# Patient Record
Sex: Male | Born: 1975
Health system: Southern US, Community
[De-identification: ages and names within clinical notes are randomized; demographics above are authoritative.]

## PROBLEM LIST (undated history)

## (undated) DIAGNOSIS — T50905A Adverse effect of unspecified drugs, medicaments and biological substances, initial encounter: Secondary | ICD-10-CM

## (undated) DIAGNOSIS — Z8619 Personal history of other infectious and parasitic diseases: Secondary | ICD-10-CM

## (undated) DIAGNOSIS — K625 Hemorrhage of anus and rectum: Secondary | ICD-10-CM

## (undated) DIAGNOSIS — K5090 Crohn's disease, unspecified, without complications: Secondary | ICD-10-CM

## (undated) HISTORY — DX: Personal history of other infectious and parasitic diseases: Z86.19

## (undated) HISTORY — DX: Hemorrhage of anus and rectum: K62.5

## (undated) HISTORY — DX: Crohn's disease, unspecified, without complications: K50.90

## (undated) HISTORY — DX: Adverse effect of unspecified drugs, medicaments and biological substances, initial encounter: T50.905A

---
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
## 1997-01-12 HISTORY — PX: SHOULDER SURGERY: SHX246

## 1997-05-14 ENCOUNTER — Ambulatory Visit (HOSPITAL_BASED_OUTPATIENT_CLINIC_OR_DEPARTMENT_OTHER): Admission: RE | Admit: 1997-05-14 | Discharge: 1997-05-14 | Payer: Self-pay | Admitting: Orthopedic Surgery

## 2003-01-13 HISTORY — PX: ARTHROSCOPIC REPAIR ACL: SUR80

## 2003-08-15 ENCOUNTER — Emergency Department (HOSPITAL_COMMUNITY): Admission: EM | Admit: 2003-08-15 | Discharge: 2003-08-16 | Payer: Self-pay | Admitting: Emergency Medicine

## 2003-09-03 ENCOUNTER — Ambulatory Visit (HOSPITAL_BASED_OUTPATIENT_CLINIC_OR_DEPARTMENT_OTHER): Admission: RE | Admit: 2003-09-03 | Discharge: 2003-09-03 | Payer: Self-pay | Admitting: Orthopedic Surgery

## 2003-09-18 ENCOUNTER — Ambulatory Visit (HOSPITAL_COMMUNITY): Admission: RE | Admit: 2003-09-18 | Discharge: 2003-09-18 | Payer: Self-pay | Admitting: Orthopedic Surgery

## 2006-08-25 ENCOUNTER — Ambulatory Visit: Payer: Self-pay | Admitting: Family Medicine

## 2006-08-25 DIAGNOSIS — J309 Allergic rhinitis, unspecified: Secondary | ICD-10-CM | POA: Insufficient documentation

## 2006-08-25 DIAGNOSIS — R5383 Other fatigue: Secondary | ICD-10-CM

## 2006-08-25 DIAGNOSIS — R5381 Other malaise: Secondary | ICD-10-CM | POA: Insufficient documentation

## 2006-08-25 DIAGNOSIS — R21 Rash and other nonspecific skin eruption: Secondary | ICD-10-CM | POA: Insufficient documentation

## 2006-08-30 ENCOUNTER — Ambulatory Visit: Payer: Self-pay | Admitting: Family Medicine

## 2006-08-30 DIAGNOSIS — D696 Thrombocytopenia, unspecified: Secondary | ICD-10-CM

## 2006-08-30 HISTORY — DX: Thrombocytopenia, unspecified: D69.6

## 2006-08-30 LAB — CONVERTED CEMR LAB
BUN: 13 mg/dL (ref 6–23)
Basophils Relative: 0.1 % (ref 0.0–1.0)
CO2: 28 meq/L (ref 19–32)
Calcium: 9.4 mg/dL (ref 8.4–10.5)
Creatinine, Ser: 1.1 mg/dL (ref 0.4–1.5)
Eosinophils Absolute: 0.2 10*3/uL (ref 0.0–0.6)
Glucose, Bld: 94 mg/dL (ref 70–99)
Hemoglobin: 13.6 g/dL (ref 13.0–17.0)
Neutro Abs: 2.5 10*3/uL (ref 1.4–7.7)
Neutrophils Relative %: 46.9 % (ref 43.0–77.0)
Platelets: 126 10*3/uL — ABNORMAL LOW (ref 150–400)
RBC: 4.64 M/uL (ref 4.22–5.81)
RDW: 13.2 % (ref 11.5–14.6)
Sed Rate: 17 mm/hr (ref 0–20)
TSH: 3.49 microintl units/mL (ref 0.35–5.50)

## 2006-09-02 ENCOUNTER — Ambulatory Visit: Payer: Self-pay | Admitting: Family Medicine

## 2006-09-02 LAB — CONVERTED CEMR LAB
Basophils Absolute: 0 10*3/uL (ref 0.0–0.1)
Basophils Relative: 0.2 % (ref 0.0–1.0)
Eosinophils Relative: 7 % — ABNORMAL HIGH (ref 0.0–5.0)
HCT: 39 % (ref 39.0–52.0)
Hemoglobin: 13.5 g/dL (ref 13.0–17.0)
MCHC: 34.5 g/dL (ref 30.0–36.0)
Monocytes Relative: 10.9 % (ref 3.0–11.0)
Neutro Abs: 2.3 10*3/uL (ref 1.4–7.7)
RBC: 4.52 M/uL (ref 4.22–5.81)
RDW: 13.2 % (ref 11.5–14.6)

## 2006-12-17 ENCOUNTER — Ambulatory Visit: Payer: Self-pay | Admitting: Internal Medicine

## 2006-12-17 DIAGNOSIS — K625 Hemorrhage of anus and rectum: Secondary | ICD-10-CM | POA: Insufficient documentation

## 2006-12-17 HISTORY — DX: Hemorrhage of anus and rectum: K62.5

## 2010-05-30 NOTE — Op Note (Signed)
NAMEJIN, Jesus Lambert                          ACCOUNT NO.:  [id number]   MEDICAL RECORD NO.:  [id number]                   PATIENT TYPE:  AMB   LOCATION:                                       FACILITY:  [facility]   PHYSICIAN:  Feliberto Gottron. Turner Daniels, M.D.                DATE OF BIRTH:  1976/01/04   DATE OF PROCEDURE:  09/03/2003  DATE OF DISCHARGE:                                 OPERATIVE REPORT   PREOPERATIVE DIAGNOSES:  Left knee medial and lateral meniscal tears and  anterior cruciate ligament tear.   POSTOPERATIVE DIAGNOSES:  Left knee medial and lateral meniscal tears and  anterior cruciate ligament tear.   PROCEDURE:  Left knee arthroscopic partial medial and lateral meniscectomies  as well as autograft anterior cruciate ligament reconstruction using a 10 mm  wide bone-tendon-bone autograft, with two L[employer]c Bio-screws for fixation.   SURGEON:  Feliberto Gottron. Turner Daniels, M.D.   ASSISTANTLaural Benes. Su Hilt, P.A.-C.   ANESTHESIA:  General LMA.   ESTIMATED BLOOD LOSS:  Minimal.   FLUIDS REPLACED:  Was 1500 chief complain of crystalloid.   DRAINS:  None.   TOURNIQUET TIME:  None.   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  He came to my office with an MRI in hand, and a 2-3+ effusion.  He wishes to participate in active sports, and because of this, and the  nature of his injury, an ACL reconstruction with a meniscal repair or  debridement was discussed with the patient.  In order to decrease pain and  increase function, an ACL reconstruction is desired by the patient.   DESCRIPTION OF PROCEDURE:  The patient is identified by arm band and taken  to the operating room at [facility].  Appropriate anesthetic monitors were attached and general LMA anesthesia  induced with the patient in the supine position.  The tourniquet applied  high to the left thigh, but never used.  The lateral posts and foot  positioner applied.  The left lower extremity was prepped and draped in the  usual sterile fashion from the ankle to the mid-thigh.  We began the  procedure by making standard inferomedial and inferolateral peripatellar  portals, allowing introduction of the arthroscope through the inferolateral  portal, and outflow to the inferomedial portal.  Serosanguineous fluid was  lavaged out. We immediately identified an inner third bucket handle tear of  the medial meniscus, white on white, which was then removed with the  straight biters and the 4.2 great white sucker shaver, as well as the 3.5  gator sucker shaver.  On the lateral side complex a double tear, big tear  was identified and likewise debrided.  The ACL was noted to be torn off of  its origin in  the notch and this was also debrided.  Having completed the  meniscal debridement, the knee was then placed in the foot positioner and  flexed to about 45 degrees, allowing the complete removal of the ACL stumps,  as well as standard superior lateral notch-plasties and a 4.2 great white  sucker shaver.  Satisfied with this, we went ahead and harvested our graft,  making an anterior midline incision from mid-patella to 1 cm distal to and  medial to the tibial tubercle, through the skin and subcutaneous tissue, to  the transverse retinaculum over the patellar tendon.  Small bleeders were  identified and cauterized.  The patellar tendon was measured at 31 mm, and a  standard 10 mm bone-tendon-bone autograft was harvested from the mid-patella  down to the tibial tubercle where we likewise took a 22 mm bone block.  A  #20 gauge steel wire was placed through the distal bone block and the graft  taken to the back table and sized so it would fit through 10 mm sizing  rings.  Meanwhile the Linvatec tibial pin guide was used and set at 55  degrees, to place the guide pin from the medial  flare of the tibia up  through the posterior footprint of the ACL insertion, and then over-reamed  with a 10 mm Badger reamer, utilizing the anterior midline incision.  Through the first tunnel a second pin was passed into over-the-top position  7 mm anterior to the posterior edge of the lateral femoral condyle at the  1:30 position on the clock.  This was over-reamed with a 10 mm Badger reamer  to a depth of 30 mm, and then utilizing the first tunnel, a notch was placed  superiorly in the femoral tunnel and laterally in the tibial tunnel.  The  autograft was then threaded through the tunnels using guide pins to push it  up, and the patellar bone block was threaded into the femoral tunnel, the  knee hyper-flexed and then this block locked into place using an 8 x 20 Bio-  screw from [employer], obtaining good squeaking as the screw drove home.  The  knee was taken through a range of motion, confirming isometry.  Placed at 45  degrees flexion with a reverse Lachman's maneuver, twisted 90 degrees, and  then a second 9 x 20 Bio-screw placed compressing the tibial bone block into  the tibia.  The graft was probed to make sure that the tension was good in  flexion and full extension.  Photographic documentation was made of the  graft.  The knee was irrigated out with normal saline solution, and the  arthroscopic instruments removed.  The patella retinaculum closed with #3-0  Vicryl suture.  The subcutaneous tissue closed with #3-0 Vicryl suture, and  the skin with running #4-0 Monocryl subcuticular suture.  A dressing of  Xeroform, 4 x 4 dressing, sponges, Webril and an Ace wrap applied.  The patient was taken out of the foot positioner, awakened, and taken to the  recovery room without difficulty.                                               Feliberto Gottron. Turner Daniels, M.D.    Ovid Curd  D:  09/19/2003  T:  09/19/2003  Job:  [id number]

## 2012-04-13 ENCOUNTER — Ambulatory Visit: Payer: Self-pay

## 2012-05-30 ENCOUNTER — Other Ambulatory Visit: Payer: Self-pay | Admitting: Gastroenterology

## 2012-05-30 ENCOUNTER — Ambulatory Visit
Admission: RE | Admit: 2012-05-30 | Discharge: 2012-05-30 | Disposition: A | Discharge status: Home / Self Care | Payer: BC Managed Care – PPO | Source: Ambulatory Visit | Attending: Gastroenterology | Admitting: Gastroenterology

## 2012-05-30 DIAGNOSIS — R0781 Pleurodynia: Secondary | ICD-10-CM

## 2012-06-02 ENCOUNTER — Other Ambulatory Visit: Payer: Self-pay | Admitting: Gastroenterology

## 2012-06-02 DIAGNOSIS — R109 Unspecified abdominal pain: Secondary | ICD-10-CM

## 2012-06-03 ENCOUNTER — Other Ambulatory Visit: Payer: Self-pay | Admitting: Gastroenterology

## 2012-06-03 ENCOUNTER — Ambulatory Visit
Admission: RE | Admit: 2012-06-03 | Discharge: 2012-06-03 | Disposition: A | Discharge status: Home / Self Care | Payer: BC Managed Care – PPO | Source: Ambulatory Visit | Attending: Gastroenterology | Admitting: Gastroenterology

## 2012-06-03 DIAGNOSIS — R1011 Right upper quadrant pain: Secondary | ICD-10-CM

## 2012-06-03 DIAGNOSIS — R7989 Other specified abnormal findings of blood chemistry: Secondary | ICD-10-CM

## 2012-06-03 MED ORDER — IOHEXOL 300 MG/ML  SOLN
100.0000 mL | Freq: Once | INTRAMUSCULAR | Status: AC | PRN
  Administered 2012-06-03: 100 mL via INTRAVENOUS

## 2012-06-08 ENCOUNTER — Other Ambulatory Visit: Payer: Self-pay | Admitting: Gastroenterology

## 2012-06-08 DIAGNOSIS — R109 Unspecified abdominal pain: Secondary | ICD-10-CM

## 2012-06-09 ENCOUNTER — Encounter (HOSPITAL_COMMUNITY): Payer: Self-pay | Admitting: Nurse Practitioner

## 2012-06-09 ENCOUNTER — Inpatient Hospital Stay (HOSPITAL_COMMUNITY)
Admission: EM | Admit: 2012-06-09 | Discharge: 2012-06-12 | DRG: 552 | Disposition: A | Discharge status: Home / Self Care | Payer: BC Managed Care – PPO | Attending: Internal Medicine | Admitting: Internal Medicine

## 2012-06-09 DIAGNOSIS — D696 Thrombocytopenia, unspecified: Secondary | ICD-10-CM

## 2012-06-09 DIAGNOSIS — R21 Rash and other nonspecific skin eruption: Secondary | ICD-10-CM

## 2012-06-09 DIAGNOSIS — R5383 Other fatigue: Secondary | ICD-10-CM

## 2012-06-09 DIAGNOSIS — R634 Abnormal weight loss: Secondary | ICD-10-CM | POA: Diagnosis present

## 2012-06-09 DIAGNOSIS — R112 Nausea with vomiting, unspecified: Secondary | ICD-10-CM

## 2012-06-09 DIAGNOSIS — K5090 Crohn's disease, unspecified, without complications: Principal | ICD-10-CM | POA: Diagnosis present

## 2012-06-09 DIAGNOSIS — K625 Hemorrhage of anus and rectum: Secondary | ICD-10-CM

## 2012-06-09 DIAGNOSIS — K50919 Crohn's disease, unspecified, with unspecified complications: Secondary | ICD-10-CM

## 2012-06-09 DIAGNOSIS — R5381 Other malaise: Secondary | ICD-10-CM

## 2012-06-09 DIAGNOSIS — E43 Unspecified severe protein-calorie malnutrition: Secondary | ICD-10-CM | POA: Insufficient documentation

## 2012-06-09 DIAGNOSIS — R197 Diarrhea, unspecified: Secondary | ICD-10-CM | POA: Diagnosis present

## 2012-06-09 DIAGNOSIS — M130 Polyarthritis, unspecified: Secondary | ICD-10-CM

## 2012-06-09 DIAGNOSIS — J309 Allergic rhinitis, unspecified: Secondary | ICD-10-CM

## 2012-06-09 DIAGNOSIS — K859 Acute pancreatitis without necrosis or infection, unspecified: Secondary | ICD-10-CM | POA: Diagnosis present

## 2012-06-09 DIAGNOSIS — IMO0002 Reserved for concepts with insufficient information to code with codable children: Secondary | ICD-10-CM

## 2012-06-09 DIAGNOSIS — E860 Dehydration: Secondary | ICD-10-CM | POA: Diagnosis present

## 2012-06-09 DIAGNOSIS — K639 Disease of intestine, unspecified: Secondary | ICD-10-CM | POA: Diagnosis present

## 2012-06-09 HISTORY — DX: Crohn's disease, unspecified, without complications: K50.90

## 2012-06-09 LAB — CBC
HCT: 35.4 % — ABNORMAL LOW (ref 39.0–52.0)
MCH: 29.2 pg (ref 26.0–34.0)
MCHC: 35 g/dL (ref 30.0–36.0)
MCV: 83.5 fL (ref 78.0–100.0)
RDW: 13.1 % (ref 11.5–15.5)

## 2012-06-09 LAB — COMPREHENSIVE METABOLIC PANEL
ALT: 10 U/L (ref 0–53)
Albumin: 3.4 g/dL — ABNORMAL LOW (ref 3.5–5.2)
BUN: 10 mg/dL (ref 6–23)
CO2: 23 mEq/L (ref 19–32)
Potassium: 3.9 mEq/L (ref 3.5–5.1)

## 2012-06-09 LAB — URINALYSIS, ROUTINE W REFLEX MICROSCOPIC
Glucose, UA: NEGATIVE mg/dL
Ketones, ur: 15 mg/dL — AB
Nitrite: NEGATIVE
Protein, ur: NEGATIVE mg/dL

## 2012-06-09 LAB — CBC WITH DIFFERENTIAL/PLATELET
Basophils Relative: 0 % (ref 0–1)
HCT: 40.9 % (ref 39.0–52.0)
MCH: 29.2 pg (ref 26.0–34.0)
MCHC: 34.7 g/dL (ref 30.0–36.0)
MCV: 84 fL (ref 78.0–100.0)
Monocytes Relative: 7 % (ref 3–12)
Neutro Abs: 11.7 10*3/uL — ABNORMAL HIGH (ref 1.7–7.7)
Platelets: 407 10*3/uL — ABNORMAL HIGH (ref 150–400)
RBC: 4.87 MIL/uL (ref 4.22–5.81)
RDW: 13.3 % (ref 11.5–15.5)
WBC: 14.2 10*3/uL — ABNORMAL HIGH (ref 4.0–10.5)

## 2012-06-09 LAB — LIPASE, BLOOD: Lipase: 1381 U/L — ABNORMAL HIGH (ref 11–59)

## 2012-06-09 LAB — POCT I-STAT, CHEM 8
Calcium, Ion: 1.19 mmol/L (ref 1.12–1.23)
Chloride: 103 mEq/L (ref 96–112)
Creatinine, Ser: 1.1 mg/dL (ref 0.50–1.35)
Glucose, Bld: 89 mg/dL (ref 70–99)
HCT: 44 % (ref 39.0–52.0)

## 2012-06-09 LAB — URINE MICROSCOPIC-ADD ON

## 2012-06-09 MED ORDER — HYDROMORPHONE HCL PF 1 MG/ML IJ SOLN
1.0000 mg | Freq: Once | INTRAMUSCULAR | Status: AC
  Administered 2012-06-09: 1 mg via INTRAVENOUS
  Filled 2012-06-09: qty 1

## 2012-06-09 MED ORDER — MESALAMINE 1.2 G PO TBEC
2400.0000 mg | DELAYED_RELEASE_TABLET | Freq: Two times a day (BID) | ORAL | Status: DC
  Filled 2012-06-09 (×3): qty 2

## 2012-06-09 MED ORDER — DIAZEPAM 5 MG/ML IJ SOLN
2.5000 mg | Freq: Once | INTRAMUSCULAR | Status: AC
  Administered 2012-06-09: 2.5 mg via INTRAVENOUS
  Filled 2012-06-09: qty 2

## 2012-06-09 MED ORDER — NAPROXEN 500 MG PO TABS
500.0000 mg | ORAL_TABLET | Freq: Two times a day (BID) | ORAL | Status: DC
  Filled 2012-06-09 (×3): qty 1

## 2012-06-09 MED ORDER — SODIUM CHLORIDE 0.9 % IV BOLUS (SEPSIS)
1000.0000 mL | Freq: Once | INTRAVENOUS | Status: AC
  Administered 2012-06-09: 1000 mL via INTRAVENOUS

## 2012-06-09 MED ORDER — ENOXAPARIN SODIUM 40 MG/0.4ML ~~LOC~~ SOLN
40.0000 mg | SUBCUTANEOUS | Status: DC
  Administered 2012-06-09 – 2012-06-10 (×2): 40 mg via SUBCUTANEOUS
  Filled 2012-06-09 (×4): qty 0.4

## 2012-06-09 MED ORDER — SODIUM CHLORIDE 0.9 % IV SOLN
INTRAVENOUS | Status: AC
  Administered 2012-06-09: 21:00:00 via INTRAVENOUS

## 2012-06-09 NOTE — H&P (Signed)
[facility] History and Physical  Jesus Lambert UUV:[id number] DOB: 02-05-1975 DOA: 06/09/2012  Referring physician: Emergency department PCP: Kerby Nora, MD  Specialists: Dr. Loreta Ave  Chief Complaint: Diarrhea, joint pains  HPI: Jesus Lambert is a 37 y.o. male who was referred to the emergency department by his gastroenterologist, Dr. Loreta Ave. Patient was diagnosed with Crohn's disease as an outpatient approximately 5 weeks prior to this hospital admission. It is during this time that the patient apparently was continued on mesalamine as an outpatient. The patient traveled out of the state for an event several weeks ago at which point he started noticing increased diarrhea and abdominal discomfort. Upon returning from his trip, the patient's he noted generalized malaise as well as multiple joint pains. Patient denies any fevers, oral or genital ulcers. Patient did recall a brief episode of "blurry vision"recently that had self resolved. The patient does report approximately 18 pounds weight loss since the onset of the symptoms.  Review of Systems:  Notable for weight loss, multiple joint pains, neck pain, diarrhea all other review of systems reviewed and are negative  Past Medical History  Diagnosis Date  . Crohn's disease    Past Surgical History  Procedure Laterality Date  . Appendectomy     Social History:  reports that he has never smoked. He does not have any smokeless tobacco history on file. He reports that  drinks alcohol. He reports that he does not use illicit drugs.   Allergies  Allergen Reactions  . Lactose Intolerance (Gi)     History reviewed. No pertinent family history.  (be sure to complete)  Prior to Admission medications   Medication Sig Start Date End Date Taking? Authorizing Provider  fish oil-omega-3 fatty acids 1000 MG capsule Take 2 g by mouth daily.   Yes Historical Provider, MD  mesalamine (LIALDA) 1.2 G EC tablet Take 2,400 mg by mouth 2 (two) times  daily.   Yes Historical Provider, MD  traMADol (ULTRAM) 50 MG tablet Take 50 mg by mouth every 6 (six) hours as needed for pain.   Yes Historical Provider, MD  triamcinolone (NASACORT) 55 MCG/ACT nasal inhaler Place 2 sprays into the nose daily as needed (for allergies).   Yes Historical Provider, MD   Physical Exam: Filed Vitals:   06/09/12 1305 06/09/12 1600 06/09/12 1630 06/09/12 1700  BP: 111/74 112/73 131/69 117/71  Pulse: 86 86 91 83  Temp: 98.1 F (36.7 C)     TempSrc: Oral     Resp: 16     SpO2: 99% 100% 100% 100%     General:  Patient is awake in no apparent distress  Eyes: Pupils are equal round reactive to light bilaterally  ENT: Because membranes moist, no oral lesions  Neck: Trachea midline, neck tender over upper posterior neck  Cardiovascular: Regular S1-S2  Respiratory: Normal respiratory effort, no crackles no wheezing  Abdomen: Soft, positive bowel sounds  Skin: No notable skin lesions seen, normal skin turgor  Musculoskeletal: No clubbing or cyanosis, distally perfused  Psychiatric: Appears normal  Neurologic: Cranial nerves II through XII grossly intact, strength and sensation intact throughout  Labs on Admission:  Basic Metabolic Panel:  Recent Labs Lab 06/09/12 1607 06/09/12 1627  NA 135 138  K 3.9 4.1  CL 97 103  CO2 23  --   GLUCOSE 90 89  BUN 10 9  CREATININE 1.12 1.10  CALCIUM 9.8  --    Liver Function Tests:  Recent Labs Lab 06/09/12 1607  AST 13  ALT 10  ALKPHOS 55  BILITOT 0.5  PROT 8.5*  ALBUMIN 3.4*    Recent Labs Lab 06/09/12 1607  LIPASE 1381*   No results found for this basename: AMMONIA,  in the last 168 hours CBC:  Recent Labs Lab 06/09/12 1607 06/09/12 1627  WBC 14.2*  --   NEUTROABS 11.7*  --   HGB 14.2 15.0  HCT 40.9 44.0  MCV 84.0  --   PLT 407*  --    Cardiac Enzymes: No results found for this basename: CKTOTAL, CKMB, CKMBINDEX, TROPONINI,  in the last 168 hours  BNP (last 3 results) No  results found for this basename: PROBNP,  in the last 8760 hours CBG: No results found for this basename: GLUCAP,  in the last 168 hours  Radiological Exams on Admission: No results found.    Assessment/Plan Principal Problem:   Crohn's disease Active Problems:   Polyarthritis   Abnormal weight loss   Crohn's disease: -The patient is followed by Dr. Loreta Ave as an outpatient, who will see this patient while in the hospital -For now, we will continue patient's home regimen of mesalamine and will defer medication changes to gastroenterology. -For now, we will continue patient with a diet as tolerated in light of his abnormal weight loss (see below)  Polyarthritis: -This is most interesting and may be a reactive arthritis in the setting of his recently diagnosed Crohn's disease. -For the time being, we will continue the patient with naproxen 500 mg by mouth twice a day. Ultimately, he may benefit from steroids -An ESR as well as CRP have been drawn and is currently pending at the time of this dictation.  Abnormal weight loss: -As per above, we'll continue patient with a diet as tolerated for the time being. -We will consult the nutrition service in light of his reported 18 pound weight loss  DVT prophylaxis: -We'll continue patient with Lovenox   Code Status: Full (must indicate code status--if unknown or must be presumed, indicate so) Family Communication: Patient and wife in room (indicate person spoken with, if applicable, with phone number if by telephone) Disposition Plan: Pending (indicate anticipated LOS)  Time spent: 35 minutes  CHIU, STEPHEN K [facility] Pager [phone]  If 7PM-7AM, please contact night-coverage [email or web address] Password [facility] 06/09/2012, 6:04 PM

## 2012-06-09 NOTE — ED Provider Notes (Signed)
History     CSN: [id number]  Arrival date & time 06/09/12  1242   First MD Initiated Contact with Patient 06/09/12 1600      Chief Complaint  Patient presents with  . Generalized Body Aches    HPI 37 yo M presents with multiple complaints related to recent diagnosis of Crohn's disease.  He was diagnosed with Crohn's 2 weeks ago after colonoscopy.  He is followed by Dr. Loreta Ave with GI.  She called ahead and reported that she was sending the patient to be admitted.  She reports that he has lost nearly 20 pounds in less than once month.  He has severe nausea, poor PO intake, and severe fatigue.  He has had 2 weeks of diarrhea consisting of loose stool with no blood or mucus, multiple episodes daily.  He has had ongoing LLQ pain for two weeks; the pain is achy, moderate in severity, unchanged for two weeks, aggravated by nothing in particular, relieved by nothing.  He also has diffuse myalgias and neck pain and joint pain in multiple joints.    He had a CT scan of his abdomen with contrast 3 days ago, and it demonstrated some non-specific bowel wall thickening, but no complication such as abscess or fistula.  He has also had a negative RUQ ultrasound for gallstones or cholecystitis.  He is taking mesalamine.  He has been on steroids, but is currently not taking steroids, as he had a poor response to them.     Past Medical History  Diagnosis Date  . Crohn's disease     Past Surgical History  Procedure Laterality Date  . Appendectomy      History reviewed. No pertinent family history.  History  Substance Use Topics  . Smoking status: Never Smoker   . Smokeless tobacco: Not on file  . Alcohol Use: Yes     Comment: occasional      Review of Systems  Constitutional: Positive for chills, appetite change, fatigue and unexpected weight change. Negative for fever.  HENT: Positive for neck pain and neck stiffness. Negative for congestion and rhinorrhea.   Eyes: Negative for visual  disturbance.  Respiratory: Negative for cough and shortness of breath.   Cardiovascular: Negative for chest pain and leg swelling.  Gastrointestinal: Positive for nausea, abdominal pain and diarrhea. Negative for vomiting, constipation, blood in stool and abdominal distention.  Genitourinary: Negative for dysuria, urgency, frequency, flank pain and difficulty urinating.  Musculoskeletal: Positive for myalgias and arthralgias. Negative for back pain and joint swelling.  Skin: Negative for rash.  Neurological: Positive for weakness. Negative for syncope, numbness and headaches.  All other systems reviewed and are negative.    Allergies  Lactose intolerance (gi)  Home Medications  No current outpatient prescriptions on file.  BP 104/68  Pulse 85  Temp(Src) 98.5 F (36.9 C) (Oral)  Resp 17  Ht 5\' 6"  (1.676 m)  Wt 141 lb 15.6 oz (64.4 kg)  BMI 22.93 kg/m2  SpO2 95%  Physical Exam  Nursing note and vitals reviewed. Constitutional: He is oriented to person, place, and time. He appears well-developed and well-nourished. No distress.  Patient looks chronically ill, uncomfortable, pale  HENT:  Head: Normocephalic and atraumatic.  Mouth/Throat: Oropharynx is clear and moist.  Eyes: Conjunctivae and EOM are normal. Pupils are equal, round, and reactive to light. No scleral icterus.  Neck: Normal range of motion. Neck supple. No JVD present. Muscular tenderness present. No spinous process tenderness present. No Brudzinski's sign and no  Kernig's sign noted.  Muscle spasm predominantly on right side  Cardiovascular: Normal rate, regular rhythm, normal heart sounds and intact distal pulses.  Exam reveals no gallop and no friction rub.   No murmur heard. Pulmonary/Chest: Effort normal and breath sounds normal. No respiratory distress. He has no wheezes. He has no rales.  Abdominal: Soft. Normal appearance and bowel sounds are normal. He exhibits no distension. There is no tenderness. There is  no rigidity, no rebound, no guarding, no CVA tenderness, no tenderness at McBurney's point and negative Murphy's sign. No hernia.  Musculoskeletal: He exhibits no edema.  Neurological: He is alert and oriented to person, place, and time. No cranial nerve deficit. He exhibits normal muscle tone. Coordination normal.  Skin: Skin is warm and dry. He is not diaphoretic.    ED Course  Procedures (including critical care time)  Labs Reviewed  LIPASE, BLOOD - Abnormal; Notable for the following:    Lipase 1381 (*)    All other components within normal limits  COMPREHENSIVE METABOLIC PANEL - Abnormal; Notable for the following:    Total Protein 8.5 (*)    Albumin 3.4 (*)    GFR calc non Af Amer 82 (*)    All other components within normal limits  CBC WITH DIFFERENTIAL - Abnormal; Notable for the following:    WBC 14.2 (*)    Platelets 407 (*)    Neutrophils Relative % 82 (*)    Lymphocytes Relative 10 (*)    Neutro Abs 11.7 (*)    All other components within normal limits  URINALYSIS, ROUTINE W REFLEX MICROSCOPIC - Abnormal; Notable for the following:    Ketones, ur 15 (*)    Leukocytes, UA SMALL (*)    All other components within normal limits  SEDIMENTATION RATE - Abnormal; Notable for the following:    Sed Rate 73 (*)    All other components within normal limits  C-REACTIVE PROTEIN - Abnormal; Notable for the following:    CRP 11.6 (*)    All other components within normal limits   Ct Cervical Spine Wo Contrast  06/10/2012   *RADIOLOGY REPORT*  Clinical Data: Neck pain.  No trauma.  Crohns disease.  CT CERVICAL SPINE WITHOUT CONTRAST  Technique:  Multidetector CT imaging of the cervical spine was performed. Multiplanar CT image reconstructions were also generated.  Comparison: None  Findings: Normal cervical alignment.  Normal lordosis.  Negative for fracture or mass.  C1-2:  Negative  C2-3:  Negative  C3-4:  Negative  C4-5:  Mild spondylosis and mild facet degeneration without spinal  stenosis.  C5-6:  Mild spondylosis and mild facet degeneration.  Left paracentral osteophyte.  Mild narrowing of the left foramen due to spurring.  C6-7:  Spondylosis with central osteophyte.  Mild facet degeneration without significant spinal stenosis  C7-C1:  Negative  IMPRESSION: Mild cervical spondylosis without significant spinal stenosis.  No acute bony change.   Original Report Authenticated By: Janeece Riggers, M.D.     1. Crohn's disease, acute, without complications   2. Nausea and vomiting   3. Weight loss   4. Other malaise and fatigue   5. Polyarthritis   6. Crohn's disease, unspecified complication   7. Abnormal weight loss       MDM  37 yo M, recently dx with Crohn's disease, presents with wt loss of 20 lb, myalgias, fatigue, arthralgias, nausea, diarrhea, failure to thrive.  Failed treatment with steroids and mesalamine so far.  Followed by Dr. Loreta Ave with  GI, sent here for admit.  Leukocytosis to 14, stable H and H, lipase 1300, moderately elevated ESR and CRP.  Needs autoimmune workup, supportive care for malnutrition/dehydration, symptom control, better control of his Crohn's.  Admitted to medicine, GI will follow.    Possible mesalamine side effect?  Doubt pancreatitis.  May need ERCP.  Stable ED course.        Toney Sang, MD 06/10/12 1328

## 2012-06-09 NOTE — ED Notes (Signed)
Pt reports he was diagnosed with crohns 2 weeks ago after colonoscopy, also had an abd ultrasound and ct scan at this time. States since he was diagnosed he still has abd pain but has also started to have joint paint all over body, and has lost 17 lbs. Pt was started on medication for crohns but has not helped.

## 2012-06-09 NOTE — ED Notes (Signed)
Report given to floor, Liborio Nixon.

## 2012-06-09 NOTE — ED Notes (Signed)
States his GI MD talked to Dr Thedore Mins about admission for further workup

## 2012-06-09 NOTE — ED Notes (Signed)
Pt given saltine crackers. 

## 2012-06-09 NOTE — Consult Note (Signed)
Reason for Consult: neck pain Referring Physician: Dr. Loreta Ave  CC: neck pain  History is obtained from:patient  HPI: Jesus Lambert is a 37 y.o. male who was recently diagnosed with ulcerative colitis and started on mesalmine. Today, he is complaining of diffuse pains in multiple joints with stiffness in those joints in addition to some right posterior neck pain. This does feel stiff similar to his other joints. He is afebrile, though does have a leukocytosis.     Past Medical History  Diagnosis Date  . Crohn's disease     Exam: Current vital signs: BP 118/70  Pulse 93  Temp(Src) 98.1 F (36.7 C) (Oral)  Resp 16  SpO2 100% Vital signs in last 24 hours: Temp:  [98.1 F (36.7 C)] 98.1 F (36.7 C) (05/29 1305) Pulse Rate:  [83-93] 93 (05/29 1800) Resp:  [16] 16 (05/29 1305) BP: (111-131)/(69-79) 118/70 mmHg (05/29 1800) SpO2:  [99 %-100 %] 100 % (05/29 1800)  General: in bed, nad CV: RR Mental Status: Patient is awake, alert, oriented to person, place, month, year, and situation. Immediate and remote memory are intact. Patient is able to give a clear and coherent history. Cranial Nerves: II: Visual Fields are full. Pupils are equal, round, and reactive to light.   III,IV, VI: EOMI without ptosis or diploplia.  V: Facial sensation is symmetric to temperature VII: Facial movement is symmetric.  Motor: Intact with 5/5 strength throughout, though limited by pain to some degree.  Sensory: Sensation is symmetric to light touch  Neck: Supple with forward/backward movements, but he resists sided to side movements secondaryt o pain.   I have reviewed labs in epic and the results pertinent to this consultation are: Elevated WBC  Impression: 37 yo M with multiple joint pain in addition to right sided neck pain. I suspect that he may have a reactive arthritis. With the unilateral nature, lack of fever, and similarity to other joint pains, I do not have a high concern for  meningitis.   Recommendations: 1) No specific recommendations from neurological standpoint, will sign off at this time.  2) Please call if any other questions remain.    Ritta Slot, MD [facility] [phone]  If 7pm- 7am, please page neurology on call at [phone].

## 2012-06-09 NOTE — ED Notes (Signed)
MD at bedside. Dr. Peyton Najjar

## 2012-06-09 NOTE — Progress Notes (Signed)
Pt orientation to unit, room and routine. Information packet given to patient/family.  Admission INP armband ID verified with patient/family, and in place. SR up x 2, fall risk assessment complete with Patient and family verbalizing understanding of risks associated with falls. Pt verbalizes an understanding of how to use the call bell and to call for help before getting out of bed.   Will cont to monitor and assist as needed.  Gilman Schmidt, RN

## 2012-06-10 ENCOUNTER — Inpatient Hospital Stay (HOSPITAL_COMMUNITY): Payer: BC Managed Care – PPO

## 2012-06-10 DIAGNOSIS — E43 Unspecified severe protein-calorie malnutrition: Secondary | ICD-10-CM

## 2012-06-10 DIAGNOSIS — R634 Abnormal weight loss: Secondary | ICD-10-CM

## 2012-06-10 DIAGNOSIS — M130 Polyarthritis, unspecified: Secondary | ICD-10-CM

## 2012-06-10 LAB — LIPID PANEL
HDL: 23 mg/dL — ABNORMAL LOW (ref >39)
LDL Cholesterol: 94 mg/dL (ref 0–99)
Total CHOL/HDL Ratio: 5.9 RATIO

## 2012-06-10 LAB — COMPREHENSIVE METABOLIC PANEL
ALT: 7 U/L (ref 0–53)
Alkaline Phosphatase: 43 U/L (ref 39–117)
BUN: 7 mg/dL (ref 6–23)
CO2: 24 mEq/L (ref 19–32)
Chloride: 103 mEq/L (ref 96–112)
GFR calc Af Amer: >90 mL/min (ref >90)
GFR calc non Af Amer: 85 mL/min — ABNORMAL LOW (ref >90)
Glucose, Bld: 95 mg/dL (ref 70–99)
Potassium: 3.9 mEq/L (ref 3.5–5.1)
Total Bilirubin: 0.4 mg/dL (ref 0.3–1.2)

## 2012-06-10 LAB — CBC
MCHC: 34.5 g/dL (ref 30.0–36.0)
Platelets: 326 10*3/uL (ref 150–400)
RDW: 13 % (ref 11.5–15.5)
WBC: 10.1 10*3/uL (ref 4.0–10.5)

## 2012-06-10 MED ORDER — MORPHINE SULFATE 2 MG/ML IJ SOLN
2.0000 mg | INTRAMUSCULAR | Status: DC | PRN
  Administered 2012-06-10 (×2): 2 mg via INTRAVENOUS
  Filled 2012-06-10 (×2): qty 1

## 2012-06-10 MED ORDER — HYDROMORPHONE HCL PF 1 MG/ML IJ SOLN
1.0000 mg | INTRAMUSCULAR | Status: DC | PRN
  Administered 2012-06-10: 1 mg via INTRAVENOUS
  Filled 2012-06-10: qty 1

## 2012-06-10 MED ORDER — METHYLPREDNISOLONE SODIUM SUCC 40 MG IJ SOLR
40.0000 mg | Freq: Two times a day (BID) | INTRAMUSCULAR | Status: DC
  Administered 2012-06-10 – 2012-06-12 (×4): 40 mg via INTRAVENOUS
  Filled 2012-06-10 (×7): qty 1

## 2012-06-10 MED ORDER — BOOST / RESOURCE BREEZE PO LIQD
1.0000 | ORAL | Status: DC
  Administered 2012-06-10: 1 via ORAL

## 2012-06-10 MED ORDER — DIAZEPAM 5 MG/ML IJ SOLN
5.0000 mg | Freq: Three times a day (TID) | INTRAMUSCULAR | Status: DC | PRN
  Administered 2012-06-10: 5 mg via INTRAVENOUS
  Filled 2012-06-10: qty 2

## 2012-06-10 NOTE — Progress Notes (Signed)
Utilization review completed.  

## 2012-06-10 NOTE — Progress Notes (Signed)
   CARE MANAGEMENT NOTE 06/10/2012  Patient:  Jesus Lambert, Jesus Lambert   Account Number:  [id number]  Date Initiated:  06/10/2012  Documentation initiated by:  Darlyne Russian  Subjective/Objective Assessment:   Patient admitted with abdominal pain, diarrhea, dx'd Crohn's disease 5 wks ago. Reports 18 lb weight loss, c/o neck and joint pain.     Action/Plan:   Progression of care and discharge planning   Anticipated DC Date:     Anticipated DC Plan:           Choice offered to / List presented to:             Status of service:   Medicare Important Message given?   (If response is "NO", the following Medicare IM given date fields will be blank) Date Medicare IM given:   Date Additional Medicare IM given:    Discharge Disposition:    Per UR Regulation:    If discussed at Long Length of Stay Meetings, dates discussed:    Comments:

## 2012-06-10 NOTE — Progress Notes (Signed)
INITIAL NUTRITION ASSESSMENT  DOCUMENTATION CODES Per approved criteria  -Severe malnutrition in the context of acute illness or injury   INTERVENTION: 1. MD: Please refer patient to [facility] for further outpatient nutritional counseling.  2. Recommend chewable multivitamin once pt advanced to full liquids. 3. Add Resource Breeze po daily, each supplement provides 250 kcal and 9 grams of protein. Please dilute with water or sugar-free beverage. 4. Monitor magnesium, potassium, and phosphorus daily for at least 3 days, MD to replete as needed, as pt is at risk for refeeding syndrome given severe malnutrition. 5. RD to continue to follow nutrition care plan.  NUTRITION DIAGNOSIS: Inadequate oral intake related to GI distress as evidenced by dietary recall and ongoing weight loss.   Goal: Intake to meet >90% of estimated nutrition needs.  Monitor:  weight trends, lab trends, I/O's, PO intake, supplement tolerance  Reason for Assessment: MD Consult  37 y.o. male  Admitting Dx: Crohn's disease  ASSESSMENT: Pt has been dx with Crohn's 5 weeks ago. During the past two weeks, he has had ongoing abdominal pain, joint pain, and has lost about 18 lbs.  Neuro evaluated pt and notes that pt may have a reactive arthritis. Ordered for Clear Liquids at this time.  Pt reports at 15% wt loss x 2-3 weeks. Has eaten very little and consumed mostly bland foods including vegetable broth, mashed potatoes, etc however diarrhea persists. Pt meets criteria for severe MALNUTRITION in the context of acute illness as evidenced by 15% wt loss x 2-3 weeks and <50% of oral intake x at least 5 days.  RD provided in-depth education with patient and wife regarding low-fiber and low-lactose diet. Provided several handouts including "5-Sample Menus for Gradually Increasing Fiber", "Low Fiber Nutrition Therapy" and "Nutrition Therapy for Inflammatory Bowel Disease" from the  [employer].  Discussed adding Resource Breeze to help with protein intake. RN reports that she will try to dilute with sugar-free liquid in order to help promote tolerance.  Height: Ht Readings from Last 1 Encounters:  06/09/12 5\' 6"  (1.676 m)    Weight: Wt Readings from Last 1 Encounters:  06/09/12 141 lb 15.6 oz (64.4 kg)    Ideal Body Weight: 142 lb  % Ideal Body Weight: 99%  Wt Readings from Last 10 Encounters:  06/09/12 141 lb 15.6 oz (64.4 kg)  12/17/06 161 lb 6.1 oz (73.202 kg)  08/25/06 160 lb 12.8 oz (72.938 kg)    Usual Body Weight: 165 lb  % Usual Body Weight: 85%  BMI:  Body mass index is 22.93 kg/(m^2). WNL  Estimated Nutritional Needs: Kcal: 1900 - 2100 Protein: 90 - 110 grams Fluid: approx 2 liters daily  Skin: intact  Diet Order: Clear Liquid  EDUCATION NEEDS: -Education needs addressed   Intake/Output Summary (Last 24 hours) at 06/10/12 0819 Last data filed at 06/09/12 1906  Gross per 24 hour  Intake   1000 ml  Output    200 ml  Net    800 ml    Last BM: 5/29  Labs:   Recent Labs Lab 06/09/12 1607 06/09/12 1627 06/10/12 0540  NA 135 138 136  K 3.9 4.1 3.9  CL 97 103 103  CO2 23  --  24  BUN 10 9 7   CREATININE 1.12 1.10 1.09  CALCIUM 9.8  --  8.5  GLUCOSE 90 89 95    CBG (last 3)  No results found for this basename: GLUCAP,  in the last 72 hours  Scheduled Meds: . sodium chloride   Intravenous STAT  . enoxaparin (LOVENOX) injection  40 mg Subcutaneous Q24H  . mesalamine  2,400 mg Oral BID  . naproxen  500 mg Oral BID WC    Continuous Infusions:   Past Medical History  Diagnosis Date  . Crohn's disease     Past Surgical History  Procedure Laterality Date  . Appendectomy      Jarold Motto MS, RD, LDN Pager: [phone] After-hours pager: [phone]

## 2012-06-10 NOTE — Progress Notes (Signed)
[facility] PROGRESS NOTE  Jesus Lambert:[id number] DOB: [birth year] DOA: 06/09/2012 PCP: Kerby Nora, MD  Brief history 37 year old male referred to the emergency department by his gastroenterologist, Dr. Loreta Ave because of polyarthritis and abdominal pain. The patient was diagnosed with Crohn's disease 5 weeks prior to admission. The patient was started on mesalamine which he states has not helped much. The patient on a business trip to [facility] approximately one week ago. He returned on 06/04/2012. He ate some "a vegetable sushi". He denies any raw or undercooked food ingestion. He does not have any pets nor does he have any other travels. He has not had fevers or chills. The patient states that he stopped taking his mesalamine 2 days ago. In addition the patient was previously on prednisone. Although the dose was unknown, the patient stopped after a 12 day taper. His last dose was 06/02/2012. The patient states that he has had arthralgias in his knees as well as his neck. His knees have been bothering him for 2 weeks. The neck has been bothering him for 4 days. He denies any recent injury or trauma. He was previously on metronidazole prior to history to suggest, but stated that it did not help his loose stools or abdominal pain much.  Assessment/Plan: Crohn's disease:  -The patient is followed by Dr. Loreta Ave as an outpatient, who will see this patient while in the hospital  -For now, we will continue patient's home regimen of mesalamine and will defer medication changes to gastroenterology.  Polyarthritis:  -likely reactive arthritis in the setting of his recently diagnosed Crohn's disease.  -Discontinue naproxen, the patient will likely need intravenous steroids or other immunomodulatory agents, but will defer definitive therapy to gastroenterology -ESR 73, CRP 11.6 Acute pancreatitis -This may be related to the patient's mesalamine versus autoimmune pancreatitis -Check ANA, ANCA -Check  lipid panel -Patient had CT abdomen pelvis 06/03/2012 which revealed inspissated stool in the distal ileum with normal small bowel and colon -Abdominal ultrasound 06/03/2012 negative for cholecystitis or common bile duct dilatation Abnormal weight loss/severe malnutrition -Nutrition has been consulted and appreciate their recommendations -We will consult the nutrition service in light of his reported 18 pound weight loss  DVT prophylaxis:  -We'll continue patient with Lovenox  Neck pain -CT neck without contrast -Suspect that this is likely due to the patient's reactive arthritis from his Crohn's disease -Continue opioids for symptomatic treatment -Doubt meningitis, appreciate neurology recommendations Code Status: Full (must indicate code status--if unknown or must be presumed, indicate so)  Family Communication: Patient and wife in room   Disposition Plan: Home when medically stable        Procedures/Studies: Ct Abdomen Pelvis W Wo Contrast  06/03/2012   *RADIOLOGY REPORT*  Clinical Data: Intermittent right upper quadrant abdominal pain over the past 2 weeks.  Back pain.  Diarrhea.  Weight loss.  CT ABDOMEN AND PELVIS WITHOUT AND WITH CONTRAST  Technique:  Multidetector CT imaging of the abdomen and pelvis was performed without contrast material in both body regions, followed by contrast material(s) and further sections in both body regions.  Contrast: OMNIPAQUE IOHEXOL 300 MG/ML IV.  Comparison: Abdominal ultrasound performed earlier same date.  No prior CT.  Findings: Unenhanced images demonstrate no urinary tract calculi on either side.  Phleboliths are noted low in both sides of the pelvis.  Surgical clips are present in the upper pelvis.  Arterial phase images demonstrate a normal appearance to the abdominal aorta, the common iliac arteries, and all  of the visceral arteries.  Geographic areas of low attenuation in both kidneys on the portal venous phase images.  No renal  parenchymal masses.  No hydronephrosis.  Normal appearing liver, spleen, pancreas, adrenal glands, and gallbladder.  No biliary ductal dilation.  No significant lymphadenopathy in the abdomen or pelvis.  Stomach normal in appearance.  Inspissated stool-like material over a long segment of the distal and terminal ileum.  Small bowel otherwise normal.  Rectum and sigmoid colon decompressed which I believe accounts for the apparent wall thickening.  Therefore, colon normal in appearance.  Appendix not clearly visualized, but no pericecal inflammation.  No ascites.  Very small umbilical hernia containing fat.  Urinary bladder unremarkable.  Prostate gland and seminal vesicles normal for age.  Bone window images unremarkable apart from an assimilation joint between the right transverse process of L5 and the first sacral ala.  Visualized lung bases clear.  Heart size normal.  IMPRESSION:  1.  Pyelonephritis involving both kidneys. 2.  Wall thickening involving the sigmoid colon and rectum is felt to be due to the fact that these segments are decompressed rather than colitis. 3.  Inspissated stool-like material over a long segment of the distal and terminal ileum, consistent with stasis.   Original Report Authenticated By: Hulan Saas, M.D.   Dg Ribs Unilateral W/chest Right  05/30/2012   *RADIOLOGY REPORT*  Clinical Data: Right anterior lower rib pain.  RIGHT RIBS AND CHEST - 3+ VIEW  Comparison: None.  Findings: Frontal view of the chest shows midline trachea and normal heart size.  Lungs are clear.  No pleural fluid.  Dedicated views of the right ribs show no acute findings.  IMPRESSION: Negative.   Original Report Authenticated By: Leanna Battles, M.D.   Ct Cervical Spine Wo Contrast  06/10/2012   *RADIOLOGY REPORT*  Clinical Data: Neck pain.  No trauma.  Crohns disease.  CT CERVICAL SPINE WITHOUT CONTRAST  Technique:  Multidetector CT imaging of the cervical spine was performed. Multiplanar CT image  reconstructions were also generated.  Comparison: None  Findings: Normal cervical alignment.  Normal lordosis.  Negative for fracture or mass.  C1-2:  Negative  C2-3:  Negative  C3-4:  Negative  C4-5:  Mild spondylosis and mild facet degeneration without spinal stenosis.  C5-6:  Mild spondylosis and mild facet degeneration.  Left paracentral osteophyte.  Mild narrowing of the left foramen due to spurring.  C6-7:  Spondylosis with central osteophyte.  Mild facet degeneration without significant spinal stenosis  C7-C1:  Negative  IMPRESSION: Mild cervical spondylosis without significant spinal stenosis.  No acute bony change.   Original Report Authenticated By: Janeece Riggers, M.D.   US Abdomen Complete  06/03/2012   *RADIOLOGY REPORT*  Clinical Data:  Abdominal pain, nausea, recently diagnosed Crohn's disease  COMPLETE ABDOMINAL ULTRASOUND  Comparison:  None.  Findings:  Gallbladder:  No gallstones, gallbladder wall thickening, or pericholecystic fluid.  Negative sonographic Murphy's sign.  Common bile duct:  Measures 3 mm.  Liver:  No focal lesion identified.  Within normal limits in parenchymal echogenicity.  IVC:  Appears normal.  Pancreas:  Incompletely visualized but grossly unremarkable.  Spleen:  Measures 4.5 cm.  Right Kidney:  Measures 10.2 cm.  No mass or hydronephrosis.  Left Kidney:  Measures 10.0 cm.  No mass or hydronephrosis.  Abdominal aorta:  No aneurysm identified.  IMPRESSION: Negative abdominal ultrasound.   Original Report Authenticated By: Charline Bills, M.D.         Subjective: Patient continues  to complain of neck pain. He denies any visual disturbance, dysarthria, focal extremity weakness. Abdominal pain is unchanged. No vomiting. He complains of some nausea. Had 3 loose stools without any hematochezia or melena. He continues to complain of arthralgias in his knees, neck, and right index finger.  Objective: Filed Vitals:   06/09/12 2000 06/09/12 2030 06/10/12 0437 06/10/12  0858  BP: 98/57 107/65 93/54 104/68  Pulse: 82 80 77 85  Temp:  98.5 F (36.9 C) 99.1 F (37.3 C) 98.5 F (36.9 C)  TempSrc:  Oral Oral Oral  Resp:  16 16 17   Height:  5\' 6"  (1.676 m)    Weight:  64.4 kg (141 lb 15.6 oz)    SpO2: 100% 100% 98% 95%    Intake/Output Summary (Last 24 hours) at 06/10/12 1258 Last data filed at 06/10/12 0858  Gross per 24 hour  Intake   1360 ml  Output    200 ml  Net   1160 ml   Weight change:  Exam:   General:  Pt is alert, follows commands appropriately, not in acute distress  HEENT: No icterus, No thrush,[place]/AT; neck is soft and supple without any meningismus, no cervical lymphadenopathy  Cardiovascular: RRR, S1/S2, no rubs, no gallops  Respiratory: CTA bilaterally, no wheezing, no crackles, no rhonchi  Abdomen: Soft/+BS, non tender, non distended, no guarding  Extremities: No edema, No lymphangitis, No petechiae, No rashes, no synovitis noted in his knees, metacarpal phalangeal joints, or PIP joints in his hands.  Data Reviewed: Basic Metabolic Panel:  Recent Labs Lab 06/09/12 1607 06/09/12 1627 06/10/12 0540  NA 135 138 136  K 3.9 4.1 3.9  CL 97 103 103  CO2 23  --  24  GLUCOSE 90 89 95  BUN 10 9 7   CREATININE 1.12 1.10 1.09  CALCIUM 9.8  --  8.5   Liver Function Tests:  Recent Labs Lab 06/09/12 1607 06/10/12 0540  AST 13 10  ALT 10 7  ALKPHOS 55 43  BILITOT 0.5 0.4  PROT 8.5* 6.5  ALBUMIN 3.4* 2.5*    Recent Labs Lab 06/09/12 1607  LIPASE 1381*   No results found for this basename: AMMONIA,  in the last 168 hours CBC:  Recent Labs Lab 06/09/12 1607 06/09/12 1627 06/09/12 1748 06/10/12 0540  WBC 14.2*  --  12.8* 10.1  NEUTROABS 11.7*  --   --   --   HGB 14.2 15.0 12.4* 11.5*  HCT 40.9 44.0 35.4* 33.3*  MCV 84.0  --  83.5 83.9  PLT 407*  --  377 326   Cardiac Enzymes: No results found for this basename: CKTOTAL, CKMB, CKMBINDEX, TROPONINI,  in the last 168 hours BNP: No components found with  this basename: POCBNP,  CBG: No results found for this basename: GLUCAP,  in the last 168 hours  No results found for this or any previous visit (from the past 240 hour(s)).   Scheduled Meds: . enoxaparin (LOVENOX) injection  40 mg Subcutaneous Q24H  . feeding supplement  1 Container Oral Q24H  . mesalamine  2,400 mg Oral BID   Continuous Infusions:    Emad Brechtel, DO  [facility] Pager [phone]  If 7PM-7AM, please contact night-coverage [email or web address] Password [email or web address] 06/10/2012, 12:58 PM   LOS: 1 day

## 2012-06-10 NOTE — Consult Note (Signed)
Reason for Consult: Crohn's disease and pancreatitis. Referring Physician: [facility].  Jesus Lambert HPI: This is a 37 year old male with a recent diagnosis of Crohn's disease.  Five weeks ago he presented to an Urgent Care with complaints of diarrhea and abdominal pain. Further evaluation with imaging suggested Crohn's disease.  He underwent a colonoscopy by Dr. Loreta Ave and he was identified to have Crohn's colitis.  His TI was negative for disease.  Lialda 4.8 grams was started, but this never helped his symptoms.  He continued to have diarrhea, 10-20 watery bowel movements per day, and weight loss.  Over the interval time period he lost 20 lbs.  The patient also complained of some joint aches and back aches and he was treated with a rapid taper of steriods x 2 by Dr. Loreta Ave.  These short courses did help with his arthralgias, but when he was off of the medication his symptoms systemically markedly worsened.  He presented to the ER yesterday with severe nuchal rigidity and headache.  He was evaluated by Neurology, but his symptoms did not appear to be neurologic in origin.  The patient also complains of a chronic right sided abdominal pain that worsens with PO intake.  No reports of nausea or vomiting.  Blood work revealed that he has a pancreatitis with a lipase at 1300.  Past Medical History  Diagnosis Date  . Crohn's disease     Past Surgical History  Procedure Laterality Date  . Appendectomy      History reviewed. No pertinent family history.  Social History:  reports that he has never smoked. He does not have any smokeless tobacco history on file. He reports that  drinks alcohol. He reports that he does not use illicit drugs.  Allergies:  Allergies  Allergen Reactions  . Lactose Intolerance (Gi)     Medications:  Scheduled: . enoxaparin (LOVENOX) injection  40 mg Subcutaneous Q24H  . feeding supplement  1 Container Oral Q24H  . methylPREDNISolone (SOLU-MEDROL) injection  40  mg Intravenous Q12H   Continuous:   Results for orders placed during the hospital encounter of 06/09/12 (from the past 24 hour(s))  LIPASE, BLOOD     Status: Abnormal   Collection Time    06/09/12  4:07 PM      Result Value Range   Lipase 1381 (*) 11 - 59 U/L  COMPREHENSIVE METABOLIC PANEL     Status: Abnormal   Collection Time    06/09/12  4:07 PM      Result Value Range   Sodium 135  135 - 145 mEq/L   Potassium 3.9  3.5 - 5.1 mEq/L   Chloride 97  96 - 112 mEq/L   CO2 23  19 - 32 mEq/L   Glucose, Bld 90  70 - 99 mg/dL   BUN 10  6 - 23 mg/dL   Creatinine, Ser 8.29  0.50 - 1.35 mg/dL   Calcium 9.8  8.4 - 56.2 mg/dL   Total Protein 8.5 (*) 6.0 - 8.3 g/dL   Albumin 3.4 (*) 3.5 - 5.2 g/dL   AST 13  0 - 37 U/L   ALT 10  0 - 53 U/L   Alkaline Phosphatase 55  39 - 117 U/L   Total Bilirubin 0.5  0.3 - 1.2 mg/dL   GFR calc non Af Amer 82 (*) >90 mL/min   GFR calc Af Amer >90  >90 mL/min  CBC WITH DIFFERENTIAL     Status: Abnormal  Collection Time    06/09/12  4:07 PM      Result Value Range   WBC 14.2 (*) 4.0 - 10.5 K/uL   RBC 4.87  4.22 - 5.81 MIL/uL   Hemoglobin 14.2  13.0 - 17.0 g/dL   HCT 16.1  09.6 - 04.5 %   MCV 84.0  78.0 - 100.0 fL   MCH 29.2  26.0 - 34.0 pg   MCHC 34.7  30.0 - 36.0 g/dL   RDW 40.9  81.1 - 91.4 %   Platelets 407 (*) 150 - 400 K/uL   Neutrophils Relative % 82 (*) 43 - 77 %   Lymphocytes Relative 10 (*) 12 - 46 %   Monocytes Relative 7  3 - 12 %   Eosinophils Relative 1  0 - 5 %   Basophils Relative 0  0 - 1 %   Neutro Abs 11.7 (*) 1.7 - 7.7 K/uL   Lymphs Abs 1.4  0.7 - 4.0 K/uL   Monocytes Absolute 1.0  0.1 - 1.0 K/uL   Eosinophils Absolute 0.1  0.0 - 0.7 K/uL   Basophils Absolute 0.0  0.0 - 0.1 K/uL   WBC Morphology ATYPICAL LYMPHOCYTES     Smear Review       Value: PLATELET CLUMPS NOTED ON SMEAR, COUNT APPEARS ADEQUATE  POCT I-STAT, CHEM 8     Status: None   Collection Time    06/09/12  4:27 PM      Result Value Range   Sodium 138  135 -  145 mEq/L   Potassium 4.1  3.5 - 5.1 mEq/L   Chloride 103  96 - 112 mEq/L   BUN 9  6 - 23 mg/dL   Creatinine, Ser 7.82  0.50 - 1.35 mg/dL   Glucose, Bld 89  70 - 99 mg/dL   Calcium, Ion 9.56  2.13 - 1.23 mmol/L   TCO2 28  0 - 100 mmol/L   Hemoglobin 15.0  13.0 - 17.0 g/dL   HCT 08.6  57.8 - 46.9 %  SEDIMENTATION RATE     Status: Abnormal   Collection Time    06/09/12  5:23 PM      Result Value Range   Sed Rate 73 (*) 0 - 16 mm/hr  C-REACTIVE PROTEIN     Status: Abnormal   Collection Time    06/09/12  5:23 PM      Result Value Range   CRP 11.6 (*) <0.60 mg/dL  CBC     Status: Abnormal   Collection Time    06/09/12  5:48 PM      Result Value Range   WBC 12.8 (*) 4.0 - 10.5 K/uL   RBC 4.24  4.22 - 5.81 MIL/uL   Hemoglobin 12.4 (*) 13.0 - 17.0 g/dL   HCT 62.9 (*) 52.8 - 41.3 %   MCV 83.5  78.0 - 100.0 fL   MCH 29.2  26.0 - 34.0 pg   MCHC 35.0  30.0 - 36.0 g/dL   RDW 24.4  01.0 - 27.2 %   Platelets 377  150 - 400 K/uL  URINALYSIS, ROUTINE W REFLEX MICROSCOPIC     Status: Abnormal   Collection Time    06/09/12  7:06 PM      Result Value Range   Color, Urine YELLOW  YELLOW   APPearance CLEAR  CLEAR   Specific Gravity, Urine 1.017  1.005 - 1.030   pH 6.0  5.0 - 8.0   Glucose, UA NEGATIVE  NEGATIVE  mg/dL   Hgb urine dipstick NEGATIVE  NEGATIVE   Bilirubin Urine NEGATIVE  NEGATIVE   Ketones, ur 15 (*) NEGATIVE mg/dL   Protein, ur NEGATIVE  NEGATIVE mg/dL   Urobilinogen, UA 0.2  0.0 - 1.0 mg/dL   Nitrite NEGATIVE  NEGATIVE   Leukocytes, UA SMALL (*) NEGATIVE  URINE MICROSCOPIC-ADD ON     Status: None   Collection Time    06/09/12  7:06 PM      Result Value Range   Squamous Epithelial / LPF RARE  RARE   WBC, UA 3-6  <3 WBC/hpf   RBC / HPF 0-2  <3 RBC/hpf   Bacteria, UA RARE  RARE  COMPREHENSIVE METABOLIC PANEL     Status: Abnormal   Collection Time    06/10/12  5:40 AM      Result Value Range   Sodium 136  135 - 145 mEq/L   Potassium 3.9  3.5 - 5.1 mEq/L   Chloride  103  96 - 112 mEq/L   CO2 24  19 - 32 mEq/L   Glucose, Bld 95  70 - 99 mg/dL   BUN 7  6 - 23 mg/dL   Creatinine, Ser 1.19  0.50 - 1.35 mg/dL   Calcium 8.5  8.4 - 14.7 mg/dL   Total Protein 6.5  6.0 - 8.3 g/dL   Albumin 2.5 (*) 3.5 - 5.2 g/dL   AST 10  0 - 37 U/L   ALT 7  0 - 53 U/L   Alkaline Phosphatase 43  39 - 117 U/L   Total Bilirubin 0.4  0.3 - 1.2 mg/dL   GFR calc non Af Amer 85 (*) >90 mL/min   GFR calc Af Amer >90  >90 mL/min  CBC     Status: Abnormal   Collection Time    06/10/12  5:40 AM      Result Value Range   WBC 10.1  4.0 - 10.5 K/uL   RBC 3.97 (*) 4.22 - 5.81 MIL/uL   Hemoglobin 11.5 (*) 13.0 - 17.0 g/dL   HCT 82.9 (*) 56.2 - 13.0 %   MCV 83.9  78.0 - 100.0 fL   MCH 29.0  26.0 - 34.0 pg   MCHC 34.5  30.0 - 36.0 g/dL   RDW 86.5  78.4 - 69.6 %   Platelets 326  150 - 400 K/uL  LIPID PANEL     Status: Abnormal   Collection Time    06/10/12  9:29 AM      Result Value Range   Cholesterol 136  0 - 200 mg/dL   Triglycerides 94  <295 mg/dL   HDL 23 (*) >28 mg/dL   Total CHOL/HDL Ratio 5.9     VLDL 19  0 - 40 mg/dL   LDL Cholesterol 94  0 - 99 mg/dL     No results found.  ROS:  As stated above in the HPI otherwise negative.  Blood pressure 104/68, pulse 85, temperature 98.5 F (36.9 C), temperature source Oral, resp. rate 17, height 5\' 6"  (1.676 m), weight 141 lb 15.6 oz (64.4 kg), SpO2 95.00%.    PE: Gen: Fatigued appearing HEENT:  [place]/AT, EOMI Neck: Supple, no LAD Lungs: CTA Bilaterally CV: RRR without M/G/R ABM: Soft, NTND, +BS Ext: No C/C/E  Assessment/Plan: 1) Crohn's disease. 2) Pancreatitis. 3) Neck stiffness. 4) Arthralgias.   The patient does not appear to have any response to Lialda for his Crohn's colitis.  He continues to have diarrhea and weight  loss.  I will start him on Solumedrol and hopefully this will bring him into remission.  As for the long term treatment, i.e., biologics, this will be determined in the near future.  He will be  check for his TPMT status and started on AZA if he is a normal metabolizer.  As for his pancreatitis, I think it is coming from the [place].  He denies any other risk factors, i.e., new medications or ETOH.  His scan is negative for pancreatitis and hopefully holding the Lialda will resolve his symptoms.  He has RUQ pain and it may be skeletal in origin.  The RUQ U/S is negative for any abnormalities.  A HIDA was ordered by Dr. Loreta Ave, but I do not think he needs it at this time as he is in severe pain from his neck and headache requiring narcotics.  I doubt he will be able to avoid using pain medications 24 hours prior to the test to prevent a false positive result.  Plan: 1) Solumedrol 40 mg Q12 hours. 2) Check TPMT. 3) Okay for a regular diet.  He can pick and choose what he wants to eat.  (He has diarrhea even if he does not eat).  Keneshia Tena D 06/10/2012, 11:03 AM

## 2012-06-11 ENCOUNTER — Inpatient Hospital Stay (HOSPITAL_COMMUNITY): Payer: BC Managed Care – PPO

## 2012-06-11 DIAGNOSIS — K5090 Crohn's disease, unspecified, without complications: Principal | ICD-10-CM

## 2012-06-11 LAB — CBC WITH DIFFERENTIAL/PLATELET
Basophils Absolute: 0 10*3/uL (ref 0.0–0.1)
Basophils Relative: 0 % (ref 0–1)
Eosinophils Absolute: 0 10*3/uL (ref 0.0–0.7)
Eosinophils Relative: 0 % (ref 0–5)
HCT: 36 % — ABNORMAL LOW (ref 39.0–52.0)
Hemoglobin: 12.1 g/dL — ABNORMAL LOW (ref 13.0–17.0)
MCH: 28.4 pg (ref 26.0–34.0)
MCHC: 33.6 g/dL (ref 30.0–36.0)
MCV: 84.5 fL (ref 78.0–100.0)
Monocytes Absolute: 0.1 10*3/uL (ref 0.1–1.0)
Monocytes Relative: 1 % — ABNORMAL LOW (ref 3–12)
RDW: 13.2 % (ref 11.5–15.5)

## 2012-06-11 LAB — BASIC METABOLIC PANEL
BUN: 9 mg/dL (ref 6–23)
Chloride: 103 mEq/L (ref 96–112)
Creatinine, Ser: 0.86 mg/dL (ref 0.50–1.35)
GFR calc Af Amer: >90 mL/min (ref >90)
Glucose, Bld: 121 mg/dL — ABNORMAL HIGH (ref 70–99)
Potassium: 4.4 mEq/L (ref 3.5–5.1)

## 2012-06-11 LAB — URINE CULTURE: Colony Count: NO GROWTH

## 2012-06-11 MED ORDER — BOOST PLUS PO LIQD
237.0000 mL | ORAL | Status: DC
  Administered 2012-06-11: 237 mL via ORAL
  Filled 2012-06-11 (×3): qty 237

## 2012-06-11 NOTE — Discharge Summary (Signed)
Physician Discharge Summary  Jesus Lambert ZOX:[id number] DOB: [birth year] DOA: 06/09/2012  PCP: Kerby Nora, MD  Admit date: 06/09/2012 Discharge date: 06/12/12 Recommendations for Outpatient Follow-up:  1. Pt will need to follow up with PCP in 2 weeks post discharge 2. Please obtain BMP to evaluate electrolytes and kidney function 3. Please also check CBC to evaluate Hg and Hct levels 4.   Follow up with Gastroenterology, Dr. Arty Baumgartner in 1-2 weeks  Discharge Diagnoses:  Principal Problem:   Crohn's disease Active Problems:   Polyarthritis   Abnormal weight loss   Protein-calorie malnutrition, severe Crohn's disease:  -The patient is followed by Dr. Loreta Ave as an outpatient  Polyarthritis:  -likely reactive arthritis in the setting of his recently diagnosed Crohn's disease.  -Patient experienced significant clinical improvement with IV Solu-Medrol -He was transitioned to oral prednisone for discharge, 40mg  daily until he follows up with Dr. Loreta Ave -He will need to follow up with his gastroenterologist in one to 2 weeks -Discontinued naproxen  -ESR 73, CRP 11.6  -CXR was neg for acute cardiopulm abnormality Acute pancreatitis  -This may be related to the patient's mesalamine versus autoimmune pancreatitis  -Check ANA, ANCA  -Check lipid panel--total cholesterol 136, LDL 94, HDL 23, triglycerides 94 -Patient had CT abdomen pelvis 06/03/2012 which revealed inspissated stool in the distal ileum with normal small bowel and colon  -Abdominal ultrasound 06/03/2012 negative for cholecystitis or common bile duct dilatation  -mesalamine was stopped -GI followed the pt throughout the hospitalization Abnormal weight loss/severe malnutrition  -Nutrition has been consulted and appreciate their recommendations  -We will consult the nutrition service in light of his reported 18 pound weight loss  DVT prophylaxis:  -We'll continue patient with Lovenox  Neck pain  -Significant improvement with IV  Solu-Medrol  -The patient is being sent home with prednisone 40 mg daily until he follows up with Dr. Loreta Ave -CT neck without contrast--mild spondylosis  -Suspect that this is likely due to the patient's reactive arthritis from his Crohn's disease  -Continue opioids for symptomatic treatment  -Doubt meningitis, appreciate neurology recommendations  Code Status: Full  Family Communication: Patient and wife in room   Brief history  37 year old male referred to the emergency department by his gastroenterologist, Dr. Loreta Ave because of polyarthritis and abdominal pain. The patient was diagnosed with Crohn's disease 5 weeks prior to admission. The patient was started on mesalamine which he states has not helped much. The patient on a business trip to [facility] approximately one week ago. He returned on 06/04/2012. He ate some "a vegetable sushi". He denies any raw or undercooked food ingestion. He does not have any pets nor does he have any other travels. He has not had fevers or chills. The patient states that he stopped taking his mesalamine 2 days ago. In addition the patient was previously on prednisone. Although the dose was unknown, the patient stopped after a 12 day taper. His last dose was 06/02/2012. The patient states that he has had arthralgias in his knees as well as his neck. His knees have been bothering him for 2 weeks. The neck has been bothering him for 4 days. He denies any recent injury or trauma. He was previously on metronidazole prior to this admission, but stated that it did not help his loose stools or abdominal pain much. The patient did have intermittent right lower rib pain with deep inspiration,, but he did not have any chest discomfort, shortness breath at rest or exertion, diaphoresis, nausea, or  vomiting. The patient's vitals remained stable without any tachycardia.   Discharge Condition: stable  Disposition:  Follow-up Information   Follow up with MANN,JYOTHI, MD In 2 weeks.    Contact information:   [address] [place] [address] [phone]       Diet: Low residue, low fiber Wt Readings from Last 3 Encounters:  06/11/12 64.683 kg (142 lb 9.6 oz)  12/17/06 73.202 kg (161 lb 6.1 oz)  08/25/06 72.938 kg (160 lb 12.8 oz)        Consultants: Gastroenterology  Discharge Exam: Filed Vitals:   06/12/12 0550  BP: 89/49  Pulse: 92  Temp: 97.8 F (36.6 C)  Resp: 18   Filed Vitals:   06/11/12 1501 06/11/12 1645 06/11/12 2004 06/12/12 0550  BP: 95/60 96/52 90/50  89/49  Pulse: 58 69 56 92  Temp: 97.9 F (36.6 C) 98.3 F (36.8 C) 98.1 F (36.7 C) 97.8 F (36.6 C)  TempSrc:   Oral Oral  Resp: 17 18 18 18   Height:   5\' 6"  (1.676 m)   Weight:   64.683 kg (142 lb 9.6 oz)   SpO2: 94% 100% 99% 98%   General: A&O x 3, NAD, pleasant, cooperative Cardiovascular: RRR, no rub, no gallop, no S3 Respiratory: CTAB, no wheeze, no rhonchi Abdomen:soft, nontender, nondistended, positive bowel sounds Extremities: No edema, No lymphangitis, no petechiae  Discharge Instructions      Discharge Orders   Future Appointments Provider Department Dept Phone   06/23/2012 9:00 AM [facility] NUCLEAR MEDICINE [phone]   NPO 6 hrs prior to scheduled exam No Opiate-based MEDS 6 hrs prior Peg Tube solutions: D/C for 6 hrs NPO after midnight   Future Orders Complete By Expires     Diet - low sodium heart healthy  As directed     Discharge instructions  As directed     Comments:      Please call Dr. Kenna Gilbert office on Monday 06/13/12 for a followup appointment    Increase activity slowly  As directed         Medication List    STOP taking these medications       mesalamine 1.2 G EC tablet  Commonly known as:  LIALDA      TAKE these medications       fish oil-omega-3 fatty acids 1000 MG capsule  Take 2 g by mouth daily.     predniSONE 20 MG tablet  Commonly known as:  DELTASONE  Take 2 tablets (40 mg total)  by mouth daily.  Start taking on:  06/13/2012     traMADol 50 MG tablet  Commonly known as:  ULTRAM  Take 50 mg by mouth every 6 (six) hours as needed for pain.     triamcinolone 55 MCG/ACT nasal inhaler  Commonly known as:  NASACORT  Place 2 sprays into the nose daily as needed (for allergies).         The results of significant diagnostics from this hospitalization (including imaging, microbiology, ancillary and laboratory) are listed below for reference.    Significant Diagnostic Studies: Ct Abdomen Pelvis W Wo Contrast  06/03/2012   *RADIOLOGY REPORT*  Clinical Data: Intermittent right upper quadrant abdominal pain over the past 2 weeks.  Back pain.  Diarrhea.  Weight loss.  CT ABDOMEN AND PELVIS WITHOUT AND WITH CONTRAST  Technique:  Multidetector CT imaging of the abdomen and pelvis was performed without contrast material in both body regions, followed  by contrast material(s) and further sections in both body regions.  Contrast: OMNIPAQUE IOHEXOL 300 MG/ML IV.  Comparison: Abdominal ultrasound performed earlier same date.  No prior CT.  Findings: Unenhanced images demonstrate no urinary tract calculi on either side.  Phleboliths are noted low in both sides of the pelvis.  Surgical clips are present in the upper pelvis.  Arterial phase images demonstrate a normal appearance to the abdominal aorta, the common iliac arteries, and all of the visceral arteries.  Geographic areas of low attenuation in both kidneys on the portal venous phase images.  No renal parenchymal masses.  No hydronephrosis.  Normal appearing liver, spleen, pancreas, adrenal glands, and gallbladder.  No biliary ductal dilation.  No significant lymphadenopathy in the abdomen or pelvis.  Stomach normal in appearance.  Inspissated stool-like material over a long segment of the distal and terminal ileum.  Small bowel otherwise normal.  Rectum and sigmoid colon decompressed which I believe accounts for the apparent wall  thickening.  Therefore, colon normal in appearance.  Appendix not clearly visualized, but no pericecal inflammation.  No ascites.  Very small umbilical hernia containing fat.  Urinary bladder unremarkable.  Prostate gland and seminal vesicles normal for age.  Bone window images unremarkable apart from an assimilation joint between the right transverse process of L5 and the first sacral ala.  Visualized lung bases clear.  Heart size normal.  IMPRESSION:  1.  Pyelonephritis involving both kidneys. 2.  Wall thickening involving the sigmoid colon and rectum is felt to be due to the fact that these segments are decompressed rather than colitis. 3.  Inspissated stool-like material over a long segment of the distal and terminal ileum, consistent with stasis.   Original Report Authenticated By: Hulan Saas, M.D.   Dg Chest 2 View  06/11/2012   *RADIOLOGY REPORT*  Clinical Data: Chest pain, Crohn's disease  CHEST - 2 VIEW  Comparison: 05/30/2012  Findings: Lungs are essentially clear.  No focal consolidation. No pleural effusion or pneumothorax.  The heart is normal in size.  Visualized osseous structures are within normal limits.  IMPRESSION: No evidence of acute cardiopulmonary disease.   Original Report Authenticated By: Charline Bills, M.D.   Dg Ribs Unilateral W/chest Right  05/30/2012   *RADIOLOGY REPORT*  Clinical Data: Right anterior lower rib pain.  RIGHT RIBS AND CHEST - 3+ VIEW  Comparison: None.  Findings: Frontal view of the chest shows midline trachea and normal heart size.  Lungs are clear.  No pleural fluid.  Dedicated views of the right ribs show no acute findings.  IMPRESSION: Negative.   Original Report Authenticated By: Leanna Battles, M.D.   Ct Cervical Spine Wo Contrast  06/10/2012   *RADIOLOGY REPORT*  Clinical Data: Neck pain.  No trauma.  Crohns disease.  CT CERVICAL SPINE WITHOUT CONTRAST  Technique:  Multidetector CT imaging of the cervical spine was performed. Multiplanar CT image  reconstructions were also generated.  Comparison: None  Findings: Normal cervical alignment.  Normal lordosis.  Negative for fracture or mass.  C1-2:  Negative  C2-3:  Negative  C3-4:  Negative  C4-5:  Mild spondylosis and mild facet degeneration without spinal stenosis.  C5-6:  Mild spondylosis and mild facet degeneration.  Left paracentral osteophyte.  Mild narrowing of the left foramen due to spurring.  C6-7:  Spondylosis with central osteophyte.  Mild facet degeneration without significant spinal stenosis  C7-C1:  Negative  IMPRESSION: Mild cervical spondylosis without significant spinal stenosis.  No acute bony change.  Original Report Authenticated By: Janeece Riggers, M.D.   US Abdomen Complete  06/03/2012   *RADIOLOGY REPORT*  Clinical Data:  Abdominal pain, nausea, recently diagnosed Crohn's disease  COMPLETE ABDOMINAL ULTRASOUND  Comparison:  None.  Findings:  Gallbladder:  No gallstones, gallbladder wall thickening, or pericholecystic fluid.  Negative sonographic Murphy's sign.  Common bile duct:  Measures 3 mm.  Liver:  No focal lesion identified.  Within normal limits in parenchymal echogenicity.  IVC:  Appears normal.  Pancreas:  Incompletely visualized but grossly unremarkable.  Spleen:  Measures 4.5 cm.  Right Kidney:  Measures 10.2 cm.  No mass or hydronephrosis.  Left Kidney:  Measures 10.0 cm.  No mass or hydronephrosis.  Abdominal aorta:  No aneurysm identified.  IMPRESSION: Negative abdominal ultrasound.   Original Report Authenticated By: Charline Bills, M.D.     Microbiology: Recent Results (from the past 240 hour(s))  URINE CULTURE     Status: None   Collection Time    06/09/12  7:06 PM      Result Value Range Status   Specimen Description URINE, CLEAN CATCH   Final   Special Requests NONE   Final   Culture  Setup Time 06/09/2012 19:49   Final   Colony Count NO GROWTH   Final   Culture NO GROWTH   Final   Report Status 06/11/2012 FINAL   Final     Labs: Basic Metabolic  Panel:  Recent Labs Lab 06/09/12 1607 06/09/12 1627 06/10/12 0540 06/11/12 0600 06/12/12 0445  NA 135 138 136 138 136  K 3.9 4.1 3.9 4.4 4.4  CL 97 103 103 103 102  CO2 23  --  24 22 25   GLUCOSE 90 89 95 121* 125*  BUN 10 9 7 9 17   CREATININE 1.12 1.10 1.09 0.86 0.87  CALCIUM 9.8  --  8.5 9.4 9.3   Liver Function Tests:  Recent Labs Lab 06/09/12 1607 06/10/12 0540 06/12/12 0445  AST 13 10 15   ALT 10 7 15   ALKPHOS 55 43 46  BILITOT 0.5 0.4 0.2*  PROT 8.5* 6.5 7.2  ALBUMIN 3.4* 2.5* 2.8*    Recent Labs Lab 06/09/12 1607  LIPASE 1381*   No results found for this basename: AMMONIA,  in the last 168 hours CBC:  Recent Labs Lab 06/09/12 1607 06/09/12 1627 06/09/12 1748 06/10/12 0540 06/11/12 0600  WBC 14.2*  --  12.8* 10.1 7.3  NEUTROABS 11.7*  --   --   --  6.4  HGB 14.2 15.0 12.4* 11.5* 12.1*  HCT 40.9 44.0 35.4* 33.3* 36.0*  MCV 84.0  --  83.5 83.9 84.5  PLT 407*  --  377 326 378   Cardiac Enzymes: No results found for this basename: CKTOTAL, CKMB, CKMBINDEX, TROPONINI,  in the last 168 hours BNP: No components found with this basename: POCBNP,  CBG: No results found for this basename: GLUCAP,  in the last 168 hours  Time coordinating discharge:  Greater than 30 minutes  Signed:  Cayle Cordoba, DO [facility] Pager: [phone] 06/12/2012, 8:31 AM

## 2012-06-11 NOTE — Progress Notes (Signed)
[facility] PROGRESS NOTE  Jesus Lambert WUJ:[id number] DOB: 07-08-1975 DOA: 06/09/2012 PCP: Kerby Nora, MD  Assessment/Plan: Crohn's disease:  -The patient is followed by Dr. Loreta Ave as an outpatient Polyarthritis:  -likely reactive arthritis in the setting of his recently diagnosed Crohn's disease.  -continue IV solumedrol per GI -clinically improving -Discontinued naproxen -ESR 73, CRP 11.6  Acute pancreatitis  -This may be related to the patient's mesalamine versus autoimmune pancreatitis  -Check ANA, ANCA  -Check lipid panel  -Patient had CT abdomen pelvis 06/03/2012 which revealed inspissated stool in the distal ileum with normal small bowel and colon  -Abdominal ultrasound 06/03/2012 negative for cholecystitis or common bile duct dilatation  Abnormal weight loss/severe malnutrition  -Nutrition has been consulted and appreciate their recommendations  -We will consult the nutrition service in light of his reported 18 pound weight loss  DVT prophylaxis:  -We'll continue patient with Lovenox  Neck pain  -Significant improvement with IV Solu-Medrol -CT neck without contrast--mild spondylosis -Suspect that this is likely due to the patient's reactive arthritis from his Crohn's disease  -Continue opioids for symptomatic treatment  -Doubt meningitis, appreciate neurology recommendations  Code Status: Full Family Communication: Patient and wife in room  Disposition Plan: Home when cleared by GI     Procedures/Studies: Ct Abdomen Pelvis W Wo Contrast  06/03/2012   *RADIOLOGY REPORT*  Clinical Data: Intermittent right upper quadrant abdominal pain over the past 2 weeks.  Back pain.  Diarrhea.  Weight loss.  CT ABDOMEN AND PELVIS WITHOUT AND WITH CONTRAST  Technique:  Multidetector CT imaging of the abdomen and pelvis was performed without contrast material in both body regions, followed by contrast material(s) and further sections in both body regions.  Contrast:  OMNIPAQUE IOHEXOL 300 MG/ML IV.  Comparison: Abdominal ultrasound performed earlier same date.  No prior CT.  Findings: Unenhanced images demonstrate no urinary tract calculi on either side.  Phleboliths are noted low in both sides of the pelvis.  Surgical clips are present in the upper pelvis.  Arterial phase images demonstrate a normal appearance to the abdominal aorta, the common iliac arteries, and all of the visceral arteries.  Geographic areas of low attenuation in both kidneys on the portal venous phase images.  No renal parenchymal masses.  No hydronephrosis.  Normal appearing liver, spleen, pancreas, adrenal glands, and gallbladder.  No biliary ductal dilation.  No significant lymphadenopathy in the abdomen or pelvis.  Stomach normal in appearance.  Inspissated stool-like material over a long segment of the distal and terminal ileum.  Small bowel otherwise normal.  Rectum and sigmoid colon decompressed which I believe accounts for the apparent wall thickening.  Therefore, colon normal in appearance.  Appendix not clearly visualized, but no pericecal inflammation.  No ascites.  Very small umbilical hernia containing fat.  Urinary bladder unremarkable.  Prostate gland and seminal vesicles normal for age.  Bone window images unremarkable apart from an assimilation joint between the right transverse process of L5 and the first sacral ala.  Visualized lung bases clear.  Heart size normal.  IMPRESSION:  1.  Pyelonephritis involving both kidneys. 2.  Wall thickening involving the sigmoid colon and rectum is felt to be due to the fact that these segments are decompressed rather than colitis. 3.  Inspissated stool-like material over a long segment of the distal and terminal ileum, consistent with stasis.   Original Report Authenticated By: Hulan Saas, M.D.   Dg Ribs Unilateral W/chest Right  05/30/2012   *RADIOLOGY REPORT*  Clinical Data: Right anterior lower rib pain.  RIGHT RIBS AND CHEST - 3+ VIEW   Comparison: None.  Findings: Frontal view of the chest shows midline trachea and normal heart size.  Lungs are clear.  No pleural fluid.  Dedicated views of the right ribs show no acute findings.  IMPRESSION: Negative.   Original Report Authenticated By: Leanna Battles, M.D.   Ct Cervical Spine Wo Contrast  06/10/2012   *RADIOLOGY REPORT*  Clinical Data: Neck pain.  No trauma.  Crohns disease.  CT CERVICAL SPINE WITHOUT CONTRAST  Technique:  Multidetector CT imaging of the cervical spine was performed. Multiplanar CT image reconstructions were also generated.  Comparison: None  Findings: Normal cervical alignment.  Normal lordosis.  Negative for fracture or mass.  C1-2:  Negative  C2-3:  Negative  C3-4:  Negative  C4-5:  Mild spondylosis and mild facet degeneration without spinal stenosis.  C5-6:  Mild spondylosis and mild facet degeneration.  Left paracentral osteophyte.  Mild narrowing of the left foramen due to spurring.  C6-7:  Spondylosis with central osteophyte.  Mild facet degeneration without significant spinal stenosis  C7-C1:  Negative  IMPRESSION: Mild cervical spondylosis without significant spinal stenosis.  No acute bony change.   Original Report Authenticated By: Janeece Riggers, M.D.   US Abdomen Complete  06/03/2012   *RADIOLOGY REPORT*  Clinical Data:  Abdominal pain, nausea, recently diagnosed Crohn's disease  COMPLETE ABDOMINAL ULTRASOUND  Comparison:  None.  Findings:  Gallbladder:  No gallstones, gallbladder wall thickening, or pericholecystic fluid.  Negative sonographic Murphy's sign.  Common bile duct:  Measures 3 mm.  Liver:  No focal lesion identified.  Within normal limits in parenchymal echogenicity.  IVC:  Appears normal.  Pancreas:  Incompletely visualized but grossly unremarkable.  Spleen:  Measures 4.5 cm.  Right Kidney:  Measures 10.2 cm.  No mass or hydronephrosis.  Left Kidney:  Measures 10.0 cm.  No mass or hydronephrosis.  Abdominal aorta:  No aneurysm identified.  IMPRESSION:  Negative abdominal ultrasound.   Original Report Authenticated By: Charline Bills, M.D.         Subjective: Patient is feeling better, but he still has some neck pain. He states his neck pain is 80% better. Denies any fevers, chills, chest pain, shortness breath, vomiting, dysuria, hematuria. He still has some abdominal pain. He complained of some right upper quadrant discomfort, right lower rib  discomfort with deep breath  Objective: Filed Vitals:   06/10/12 1843 06/10/12 2225 06/11/12 0648 06/11/12 0949  BP: 115/74 93/59 88/52  101/55  Pulse: 80 71 59 91  Temp: 98.3 F (36.8 C) 97.8 F (36.6 C) 97.9 F (36.6 C) 97.6 F (36.4 C)  TempSrc: Oral Oral Oral   Resp: 18 18 18 18   Height:  5\' 6"  (1.676 m)    Weight:  63.8 kg (140 lb 10.5 oz)    SpO2: 95% 98% 98% 97%    Intake/Output Summary (Last 24 hours) at 06/11/12 1051 Last data filed at 06/11/12 1000  Gross per 24 hour  Intake   1200 ml  Output      0 ml  Net   1200 ml   Weight change: -0.6 kg (-1 lb 5.2 oz) Exam:   General:  Pt is alert, follows commands appropriately, not in acute distress  HEENT: No icterus, No thrush, No neck mass, [place]/AT; no meningismus, neck is soft and supple  Cardiovascular: RRR, S1/S2, no rubs, no gallops  Respiratory: CTA bilaterally, no wheezing, no crackles, no rhonchi  Abdomen: Soft/+BS, diffusely tender, non distended, no guarding  Extremities: No edema, No lymphangitis, No petechiae, No rashes, no synovitis  Data Reviewed: Basic Metabolic Panel:  Recent Labs Lab 06/09/12 1607 06/09/12 1627 06/10/12 0540 06/11/12 0600  NA 135 138 136 138  K 3.9 4.1 3.9 4.4  CL 97 103 103 103  CO2 23  --  24 22  GLUCOSE 90 89 95 121*  BUN 10 9 7 9   CREATININE 1.12 1.10 1.09 0.86  CALCIUM 9.8  --  8.5 9.4   Liver Function Tests:  Recent Labs Lab 06/09/12 1607 06/10/12 0540  AST 13 10  ALT 10 7  ALKPHOS 55 43  BILITOT 0.5 0.4  PROT 8.5* 6.5  ALBUMIN 3.4* 2.5*    Recent  Labs Lab 06/09/12 1607  LIPASE 1381*   No results found for this basename: AMMONIA,  in the last 168 hours CBC:  Recent Labs Lab 06/09/12 1607 06/09/12 1627 06/09/12 1748 06/10/12 0540 06/11/12 0600  WBC 14.2*  --  12.8* 10.1 7.3  NEUTROABS 11.7*  --   --   --  6.4  HGB 14.2 15.0 12.4* 11.5* 12.1*  HCT 40.9 44.0 35.4* 33.3* 36.0*  MCV 84.0  --  83.5 83.9 84.5  PLT 407*  --  377 326 378   Cardiac Enzymes: No results found for this basename: CKTOTAL, CKMB, CKMBINDEX, TROPONINI,  in the last 168 hours BNP: No components found with this basename: POCBNP,  CBG: No results found for this basename: GLUCAP,  in the last 168 hours  Recent Results (from the past 240 hour(s))  URINE CULTURE     Status: None   Collection Time    06/09/12  7:06 PM      Result Value Range Status   Specimen Description URINE, CLEAN CATCH   Final   Special Requests NONE   Final   Culture  Setup Time 06/09/2012 19:49   Final   Colony Count NO GROWTH   Final   Culture NO GROWTH   Final   Report Status 06/11/2012 FINAL   Final     Scheduled Meds: . enoxaparin (LOVENOX) injection  40 mg Subcutaneous Q24H  . lactose free nutrition  237 mL Oral Q24H  . methylPREDNISolone (SOLU-MEDROL) injection  40 mg Intravenous Q12H   Continuous Infusions:    Stina Gane, DO  [facility] Pager [phone]  If 7PM-7AM, please contact night-coverage [email or web address] Password [facility] 06/11/2012, 10:51 AM   LOS: 2 days

## 2012-06-11 NOTE — Progress Notes (Signed)
Patient ID: Jesus Lambert, male   DOB: May 12, 1975, 37 y.o.   MRN: [id number] [facility] Progress Note  I am covering this patient today for Drs. Nicholes Mango.  Subjective: Feeling better- arthralgias improving,less abdominal pain and less diarrhea, no obvious blood.  Objective:  Vital signs in last 24 hours: Temp:  [97.6 F (36.4 C)-98.4 F (36.9 C)] 97.6 F (36.4 C) (05/31 0949) Pulse Rate:  [59-91] 91 (05/31 0949) Resp:  [18] 18 (05/31 0949) BP: (88-115)/(52-74) 101/55 mmHg (05/31 0949) SpO2:  [94 %-98 %] 97 % (05/31 0949) Weight:  [140 lb 10.5 oz (63.8 kg)] 140 lb 10.5 oz (63.8 kg) (05/30 2225) Last BM Date: 06/10/12 General:   Alert,  Well-developed, male  in NAD Heart:  Regular rate and rhythm; no murmurs Pulm;clear Abdomen:  Soft, tender mild bilat lower  and nondistended. Normal bowel sounds, without guarding, and without rebound.   Extremities:  Without edema. Neurologic:  Alert and  oriented x4;  grossly normal neurologically. Psych:  Alert and cooperative. Normal mood and affect.  Intake/Output from previous day: 05/30 0701 - 05/31 0700 In: 2621.7 [P.O.:1080; I.V.:1541.7] Out: -  Intake/Output this shift: Total I/O In: 240 [P.O.:240] Out: -   Lab Results:  Recent Labs  06/09/12 1748 06/10/12 0540 06/11/12 0600  WBC 12.8* 10.1 7.3  HGB 12.4* 11.5* 12.1*  HCT 35.4* 33.3* 36.0*  PLT 377 326 378   BMET  Recent Labs  06/09/12 1607 06/09/12 1627 06/10/12 0540 06/11/12 0600  NA 135 138 136 138  K 3.9 4.1 3.9 4.4  CL 97 103 103 103  CO2 23  --  24 22  GLUCOSE 90 89 95 121*  BUN 10 9 7 9   CREATININE 1.12 1.10 1.09 0.86  CALCIUM 9.8  --  8.5 9.4   LFT  Recent Labs  06/10/12 0540  PROT 6.5  ALBUMIN 2.5*  AST 10  ALT 7  ALKPHOS 43  BILITOT 0.4     Assessment / Plan: #1 37 yo male  With new Dx of Crohns colitis refractory to mesalamines and associated with significant arthralgias Improving on Iv Solumedrol 40 q12 Continue  current regimen  Another 24 hours, then consider home on prednisone Principal Problem:   Crohn's disease Active Problems:   Polyarthritis   Abnormal weight loss   Protein-calorie malnutrition, severe     LOS: 2 days   Amy Esterwood  06/11/2012, 9:58 AM  ________________________________________________________________________  [facility] MD note (I am covering this patient today for Drs. Nicholes Mango):  I personally examined the patient, reviewed the data and agree with the assessment and plan described above.   Rob Bunting, MD [facility] Pager [phone]

## 2012-06-12 LAB — COMPREHENSIVE METABOLIC PANEL
AST: 15 U/L (ref 0–37)
Albumin: 2.8 g/dL — ABNORMAL LOW (ref 3.5–5.2)
BUN: 17 mg/dL (ref 6–23)
Calcium: 9.3 mg/dL (ref 8.4–10.5)
Chloride: 102 mEq/L (ref 96–112)
Creatinine, Ser: 0.87 mg/dL (ref 0.50–1.35)
Total Bilirubin: 0.2 mg/dL — ABNORMAL LOW (ref 0.3–1.2)

## 2012-06-12 MED ORDER — PREDNISONE 20 MG PO TABS: 40.0000 mg | ORAL_TABLET | Freq: Every day | ORAL | Status: DC

## 2012-06-12 MED ORDER — PREDNISONE 20 MG PO TABS
40.0000 mg | ORAL_TABLET | Freq: Every day | ORAL | Status: DC
  Filled 2012-06-12: qty 2

## 2012-06-12 NOTE — Progress Notes (Signed)
Jesus Lambert.7 F (36.6 C) (06/01 0550) Pulse Rate:  [56-92] 92 (06/01 0550) Resp:  [17-18] 18 (06/01 0550) BP: (89-101)/(49-60) 89/49 mmHg (06/01 0550) SpO2:  [94 %-100 %] 98 % (06/01 0550) Weight:  [142 lb 9.6 oz (64.683 kg)] 142 lb 9.6 oz (64.683 kg) (05/31 2004) Last BM Date: 06/11/12 General: alert and oriented times 3 Heart: regular rate and rythm Abdomen: soft, non-tender, non-distended, normal bowel sounds   Lab Results:  Recent Labs  06/09/12 1748 06/10/12 0540 06/11/12 0600  WBC 12.8* 10.1 7.3  HGB 12.4* 11.5* 12.1*  PLT 377 326 378  MCV 83.5 83.9 84.5    Recent Labs  06/10/12 0540 06/11/12 0600 06/12/12 0445  NA 136 138 136  K 3.9 4.4 4.4  CL 103 103 102  CO2 24 22 25   GLUCOSE 95 121* 125*  BUN 7 9 17   CREATININE 1.09 0.86 0.87  CALCIUM 8.5 9.4 9.3    Recent Labs  06/09/12 1607 06/10/12 0540 06/12/12 0445  PROT 8.5* 6.5 7.2  ALBUMIN 3.4* 2.5* 2.8*  AST 13 10 15   ALT 10 7 15   ALKPHOS 55 43 46  BILITOT 0.5 0.4 0.2*    Medications: Scheduled Meds: . enoxaparin (LOVENOX) injection  40 mg Subcutaneous Q24H  . lactose free nutrition  237 mL Oral Q24H  . methylPREDNISolone (SOLU-MEDROL) injection  40 mg Intravenous Q12H   Continuous Infusions:  PRN Meds:.diazepam, HYDROmorphone (DILAUDID) injection, morphine injection    Assessment/Plan: 37 y.o. male crohn's colitis  His signficant worsening in symptoms may indeed have been related to mesalamine intolerance (known to actually cause an exacerbation of colitis symptoms in small number of people).  That has been stopped since Wednesday.    OK to discharge home  today on prednisone 40mg  once daily, no more lialda.  He should not taper this until seen again by Dr. Loreta Ave in her office.  I recommended he call for apt late this coming week or next week.  Probably would not retry mesalamine products (lialda, asacol etc) and would consider starting immunomodulator such as imuran or 6mp.  I will leave this to Dr. Loreta Ave.    Rob Bunting, MD  06/12/2012, 7:22 AM [facility] Pager [phone]

## 2012-06-13 LAB — ANA: Anti Nuclear Antibody(ANA): NEGATIVE

## 2012-06-13 LAB — ANCA SCREEN W REFLEX TITER: c-ANCA Screen: NEGATIVE

## 2012-06-14 NOTE — ED Provider Notes (Signed)
I saw and evaluated the patient, reviewed the resident's note and I agree with the findings and plan.   .Face to face Exam:  General:  Awake HEENT:  Atraumatic Resp:  Normal effort Abd:  Nondistended Neuro:No focal weakness   Nelia Shi, MD 06/14/12 2000

## 2012-06-23 ENCOUNTER — Encounter (HOSPITAL_COMMUNITY): Payer: BC Managed Care – PPO

## 2012-06-23 LAB — MISCELLANEOUS TEST

## 2014-04-06 IMAGING — CT CT CERVICAL SPINE W/O CM
3 of 4 series · 11 of 20 positions shown, 12 images · non-contrast
Comparison: None

CLINICAL DATA: Neck pain.  No trauma.  Crohns disease.

CT CERVICAL SPINE WITHOUT CONTRAST
TECHNIQUE: Multidetector CT imaging of the cervical spine was
performed. Multiplanar CT image reconstructions were also
generated.

[Series 2: cervical spine · axial · 0.27mm/px · z∈[-185,-95]mm · 4 of 61 slices shown, 5 images]
[im 13/61  soft-tissue]
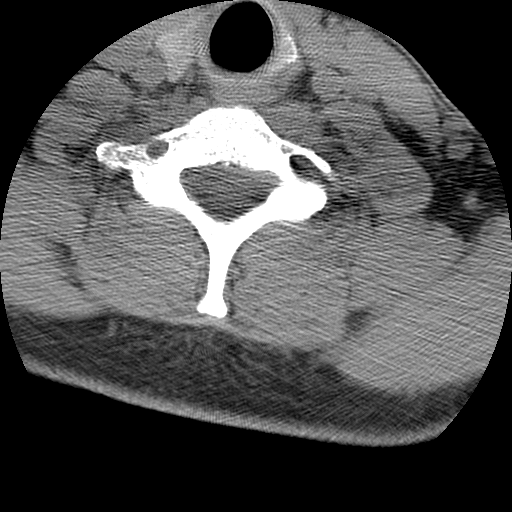
[im 13/61  bone]
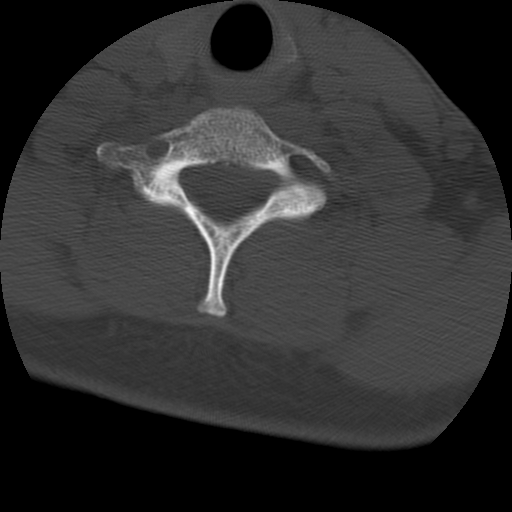
[im 25/61  bone]
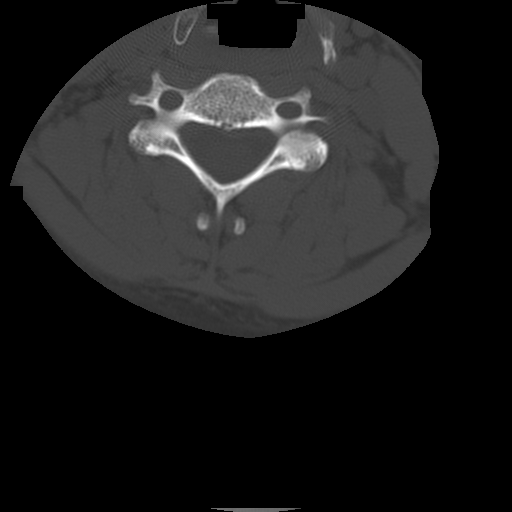
[im 37/61  bone]
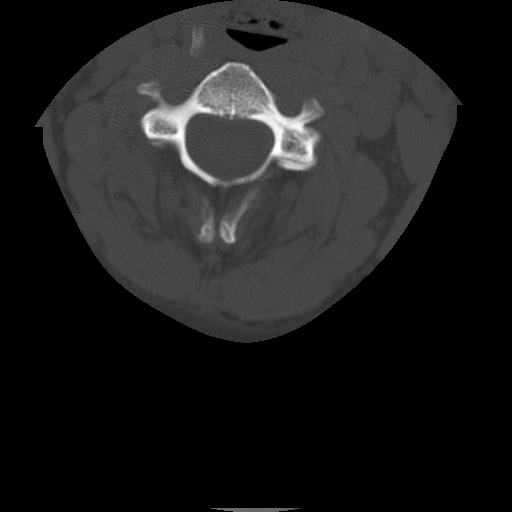
[im 49/61  bone]
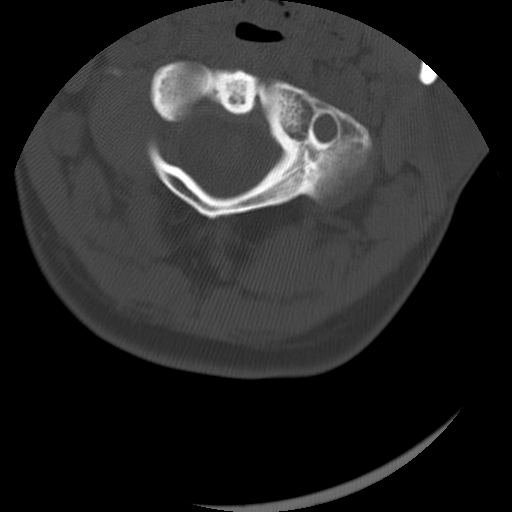

[Series 3: recon 2: cervical spine · axial · 0.27mm/px · z∈[-185,-95]mm · 4 of 61 slices shown]
[im 13/61  bone]
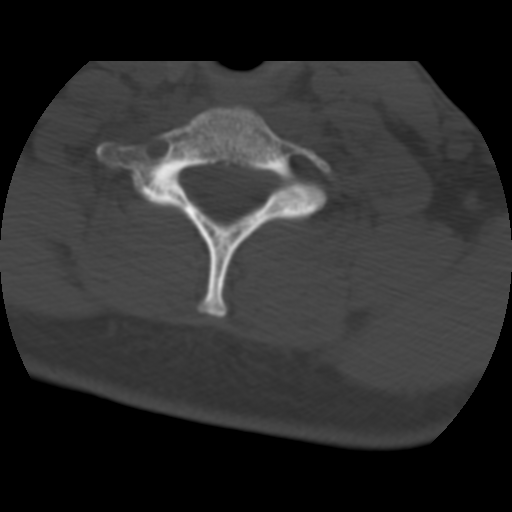
[im 25/61  bone]
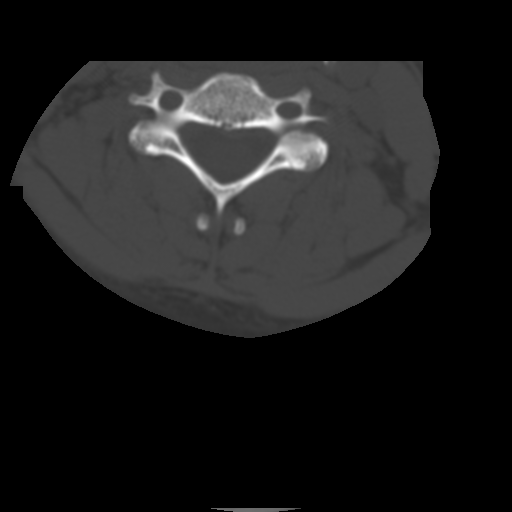
[im 37/61  bone]
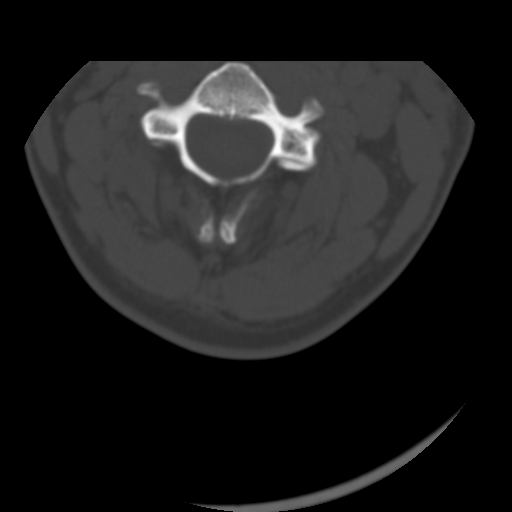
[im 49/61  bone]
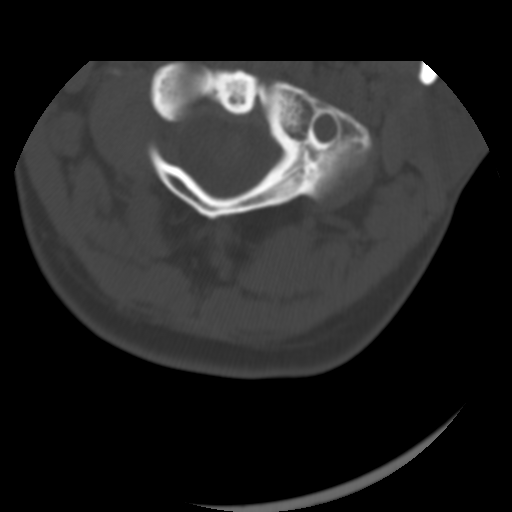

[Series 401: coronals · coronal · 0.30mm/px · 3 of 27 slices shown]
[im 6/27  bone]
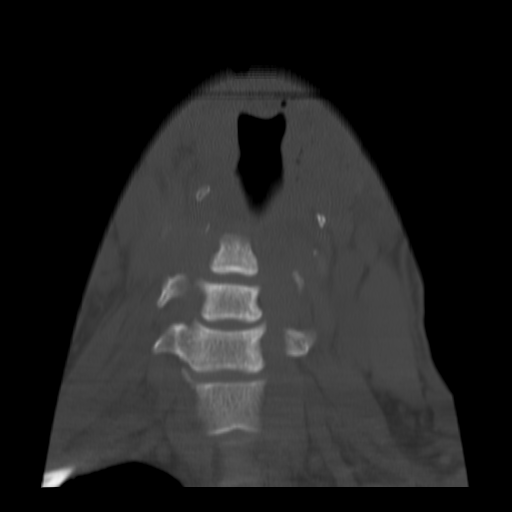
[im 11/27  bone]
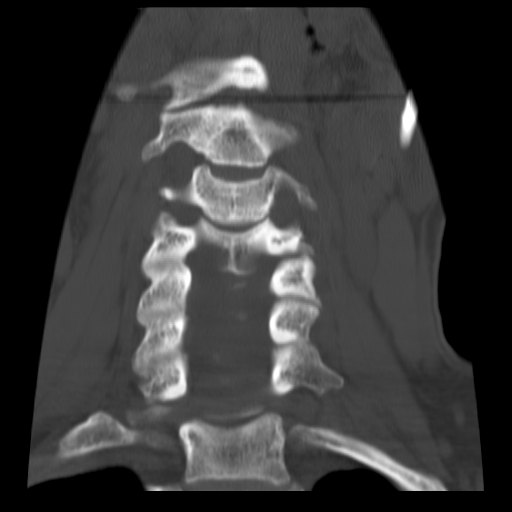
[im 16/27  bone]
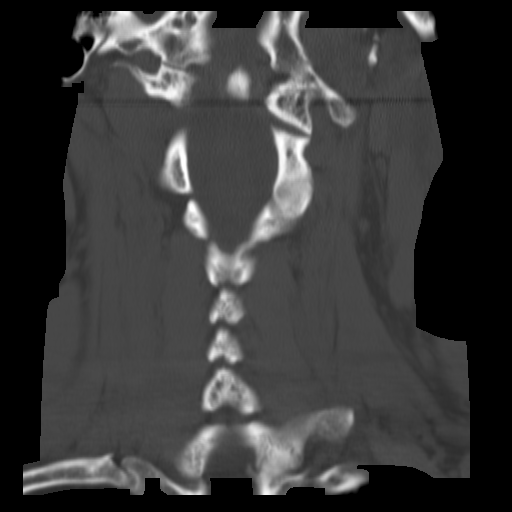

[11 of 20 positions shown; findings below may reference images not displayed]

FINDINGS: Normal cervical alignment.  Normal lordosis.  Negative
for fracture or mass.

C1-2:  Negative

C2-3:  Negative

C3-4:  Negative

C4-5:  Mild spondylosis and mild facet degeneration without spinal
stenosis.

C5-6:  Mild spondylosis and mild facet degeneration.  Left
paracentral osteophyte.  Mild narrowing of the left foramen due to
spurring.

C6-7:  Spondylosis with central osteophyte.  Mild facet
degeneration without significant spinal stenosis

C7-C1:  Negative
IMPRESSION: Mild cervical spondylosis without significant spinal stenosis.  No
acute bony change.

## 2014-10-24 ENCOUNTER — Ambulatory Visit (INDEPENDENT_AMBULATORY_CARE_PROVIDER_SITE_OTHER): Payer: BLUE CROSS/BLUE SHIELD | Admitting: Internal Medicine

## 2014-10-24 ENCOUNTER — Encounter: Payer: Self-pay | Admitting: Internal Medicine

## 2014-10-24 VITALS — BP 104/70 | Temp 98.0°F | Ht 65.5 in | Wt 162.6 lb

## 2014-10-24 DIAGNOSIS — Z23 Encounter for immunization: Secondary | ICD-10-CM

## 2014-10-24 DIAGNOSIS — K5090 Crohn's disease, unspecified, without complications: Secondary | ICD-10-CM | POA: Diagnosis not present

## 2014-10-24 DIAGNOSIS — Z0000 Encounter for general adult medical examination without abnormal findings: Secondary | ICD-10-CM | POA: Diagnosis not present

## 2014-10-24 LAB — HEPATIC FUNCTION PANEL
ALK PHOS: 41 U/L (ref 39–117)
ALT: 11 U/L (ref 0–53)
AST: 15 U/L (ref 0–37)
Albumin: 4.4 g/dL (ref 3.5–5.2)
BILIRUBIN DIRECT: 0 mg/dL (ref 0.0–0.3)
BILIRUBIN TOTAL: 0.4 mg/dL (ref 0.2–1.2)
Total Protein: 7.3 g/dL (ref 6.0–8.3)

## 2014-10-24 LAB — CBC WITH DIFFERENTIAL/PLATELET
BASOS ABS: 0 10*3/uL (ref 0.0–0.1)
Basophils Relative: 0.4 % (ref 0.0–3.0)
EOS ABS: 0.2 10*3/uL (ref 0.0–0.7)
Eosinophils Relative: 2.5 % (ref 0.0–5.0)
HCT: 42.9 % (ref 39.0–52.0)
Hemoglobin: 13.9 g/dL (ref 13.0–17.0)
LYMPHS ABS: 2.1 10*3/uL (ref 0.7–4.0)
Lymphocytes Relative: 32.3 % (ref 12.0–46.0)
MCHC: 32.5 g/dL (ref 30.0–36.0)
MCV: 89.9 fl (ref 78.0–100.0)
MONO ABS: 0.5 10*3/uL (ref 0.1–1.0)
Monocytes Relative: 7 % (ref 3.0–12.0)
NEUTROS PCT: 57.8 % (ref 43.0–77.0)
Neutro Abs: 3.8 10*3/uL (ref 1.4–7.7)
Platelets: 187 10*3/uL (ref 150.0–400.0)
RBC: 4.78 Mil/uL (ref 4.22–5.81)
RDW: 13.8 % (ref 11.5–15.5)
WBC: 6.6 10*3/uL (ref 4.0–10.5)

## 2014-10-24 LAB — BASIC METABOLIC PANEL
BUN: 14 mg/dL (ref 6–23)
CALCIUM: 9.5 mg/dL (ref 8.4–10.5)
CO2: 29 mEq/L (ref 19–32)
Chloride: 105 mEq/L (ref 96–112)
Creatinine, Ser: 1.07 mg/dL (ref 0.40–1.50)
GFR: 81.43 mL/min (ref >60.00)
GLUCOSE: 90 mg/dL (ref 70–99)
POTASSIUM: 4.1 meq/L (ref 3.5–5.1)
SODIUM: 138 meq/L (ref 135–145)

## 2014-10-24 LAB — LIPID PANEL
CHOLESTEROL: 212 mg/dL — AB (ref 0–200)
HDL: 42.7 mg/dL (ref >39.00)
LDL CALC: 138 mg/dL — AB (ref 0–99)
NONHDL: 169.12
Total CHOL/HDL Ratio: 5
Triglycerides: 158 mg/dL — ABNORMAL HIGH (ref 0.0–149.0)
VLDL: 31.6 mg/dL (ref 0.0–40.0)

## 2014-10-24 LAB — TSH: TSH: 2.23 u[IU]/mL (ref 0.35–4.50)

## 2014-10-24 NOTE — Patient Instructions (Addendum)
Will notify you  of labs when available. Can sign up for my chart .  Mediterranean diet is the most heart healthy .  Get  Release to get dr Loreta Ave to send recent records and ongoing visits   Health Maintenance, Male A healthy lifestyle and preventative care can promote health and wellness.  Maintain regular health, dental, and eye exams.  Eat a healthy diet. Foods like vegetables, fruits, whole grains, low-fat dairy products, and lean protein foods contain the nutrients you need and are low in calories. Decrease your intake of foods high in solid fats, added sugars, and salt. Get information about a proper diet from your health care provider, if necessary.  Regular physical exercise is one of the most important things you can do for your health. Most adults should get at least 150 minutes of moderate-intensity exercise (any activity that increases your heart rate and causes you to sweat) each week. In addition, most adults need muscle-strengthening exercises on 2 or more days a week.   Maintain a healthy weight. The body mass index (BMI) is a screening tool to identify possible weight problems. It provides an estimate of body fat based on height and weight. Your health care provider can find your BMI and can help you achieve or maintain a healthy weight. For males 20 years and older:  A BMI below 18.5 is considered underweight.  A BMI of 18.5 to 24.9 is normal.  A BMI of 25 to 29.9 is considered overweight.  A BMI of 30 and above is considered obese.  Maintain normal blood lipids and cholesterol by exercising and minimizing your intake of saturated fat. Eat a balanced diet with plenty of fruits and vegetables. Blood tests for lipids and cholesterol should begin at age 4 and be repeated every 5 years. If your lipid or cholesterol levels are high, you are over age 70, or you are at high risk for heart disease, you may need your cholesterol levels checked more frequently.Ongoing high lipid and  cholesterol levels should be treated with medicines if diet and exercise are not working.  If you smoke, find out from your health care provider how to quit. If you do not use tobacco, do not start.  Lung cancer screening is recommended for adults aged 55-80 years who are at high risk for developing lung cancer because of a history of smoking. A yearly low-dose CT scan of the lungs is recommended for people who have at least a 30-pack-year history of smoking and are current smokers or have quit within the past 15 years. A pack year of smoking is smoking an average of 1 pack of cigarettes a day for 1 year (for example, a 30-pack-year history of smoking could mean smoking 1 pack a day for 30 years or 2 packs a day for 15 years). Yearly screening should continue until the smoker has stopped smoking for at least 15 years. Yearly screening should be stopped for people who develop a health problem that would prevent them from having lung cancer treatment.  If you choose to drink alcohol, do not have more than 2 drinks per day. One drink is considered to be 12 oz (360 mL) of beer, 5 oz (150 mL) of wine, or 1.5 oz (45 mL) of liquor.  Avoid the use of street drugs. Do not share needles with anyone. Ask for help if you need support or instructions about stopping the use of drugs.  High blood pressure causes heart disease and increases the risk  of stroke. High blood pressure is more likely to develop in:  People who have blood pressure in the end of the normal range (100-139/85-89 mm Hg).  People who are overweight or obese.  People who are African American.  If you are 48-69 years of age, have your blood pressure checked every 3-5 years. If you are 62 years of age or older, have your blood pressure checked every year. You should have your blood pressure measured twice--once when you are at a hospital or clinic, and once when you are not at a hospital or clinic. Record the average of the two measurements. To  check your blood pressure when you are not at a hospital or clinic, you can use:  An automated blood pressure machine at a pharmacy.  A home blood pressure monitor.  If you are 1-31 years old, ask your health care provider if you should take aspirin to prevent heart disease.  Diabetes screening involves taking a blood sample to check your fasting blood sugar level. This should be done once every 3 years after age 32 if you are at a normal weight and without risk factors for diabetes. Testing should be considered at a younger age or be carried out more frequently if you are overweight and have at least 1 risk factor for diabetes.  Colorectal cancer can be detected and often prevented. Most routine colorectal cancer screening begins at the age of 32 and continues through age 54. However, your health care provider may recommend screening at an earlier age if you have risk factors for colon cancer. On a yearly basis, your health care provider may provide home test kits to check for hidden blood in the stool. A small camera at the end of a tube may be used to directly examine the colon (sigmoidoscopy or colonoscopy) to detect the earliest forms of colorectal cancer. Talk to your health care provider about this at age 53 when routine screening begins. A direct exam of the colon should be repeated every 5-10 years through age 61, unless early forms of precancerous polyps or small growths are found.  People who are at an increased risk for hepatitis B should be screened for this virus. You are considered at high risk for hepatitis B if:  You were born in a country where hepatitis B occurs often. Talk with your health care provider about which countries are considered high risk.  Your parents were born in a high-risk country and you have not received a shot to protect against hepatitis B (hepatitis B vaccine).  You have HIV or AIDS.  You use needles to inject street drugs.  You live with, or have sex  with, someone who has hepatitis B.  You are a man who has sex with other men (MSM).  You get hemodialysis treatment.  You take certain medicines for conditions like cancer, organ transplantation, and autoimmune conditions.  Hepatitis C blood testing is recommended for all people born from [birth year] through 1966 and any individual with known risk factors for hepatitis C.  Healthy men should no longer receive prostate-specific antigen (PSA) blood tests as part of routine cancer screening. Talk to your health care provider about prostate cancer screening.  Testicular cancer screening is not recommended for adolescents or adult males who have no symptoms. Screening includes self-exam, a health care provider exam, and other screening tests. Consult with your health care provider about any symptoms you have or any concerns you have about testicular cancer.  Practice safe sex.  Use condoms and avoid high-risk sexual practices to reduce the spread of sexually transmitted infections (STIs).  You should be screened for STIs, including gonorrhea and chlamydia if:  You are sexually active and are younger than 24 years.  You are older than 24 years, and your health care provider tells you that you are at risk for this type of infection.  Your sexual activity has changed since you were last screened, and you are at an increased risk for chlamydia or gonorrhea. Ask your health care provider if you are at risk.  If you are at risk of being infected with HIV, it is recommended that you take a prescription medicine daily to prevent HIV infection. This is called pre-exposure prophylaxis (PrEP). You are considered at risk if:  You are a man who has sex with other men (MSM).  You are a heterosexual man who is sexually active with multiple partners.  You take drugs by injection.  You are sexually active with a partner who has HIV.  Talk with your health care provider about whether you are at high risk of being  infected with HIV. If you choose to begin PrEP, you should first be tested for HIV. You should then be tested every 3 months for as long as you are taking PrEP.  Use sunscreen. Apply sunscreen liberally and repeatedly throughout the day. You should seek shade when your shadow is shorter than you. Protect yourself by wearing long sleeves, pants, a wide-brimmed hat, and sunglasses year round whenever you are outdoors.  Tell your health care provider of new moles or changes in moles, especially if there is a change in shape or color. Also, tell your health care provider if a mole is larger than the size of a pencil eraser.  A one-time screening for abdominal aortic aneurysm (AAA) and surgical repair of large AAAs by ultrasound is recommended for men aged 29-75 years who are current or former smokers.  Stay current with your vaccines (immunizations).   This information is not intended to replace advice given to you by your health care provider. Make sure you discuss any questions you have with your health care provider.   Document Released: 06/27/2007 Document Revised: 01/19/2014 Document Reviewed: 05/26/2010 Elsevier Interactive Patient Education [employer].

## 2014-10-24 NOTE — Progress Notes (Signed)
Pre visit review using our clinic review tool, if applicable. No additional management support is needed unless otherwise documented below in the visit note.   Chief Complaint  Patient presents with  . Establish Care  . Annual Exam    HPI: Jesus Lambert 39 y.o.   Comes in as a new patient  establishing with PCP. Sees Dr Loreta Ave for dx of  crohns disease dx when  Had sx of sharp pain left upper abdomen and chest   And cat scan was abnormal and then sent to  Dr Loreta Ave and diagnoses  Had lialda  And then had rx  Rash   And felt from med weight loss .  Hospital .   [facility]  2014  And then  Other   meds   2 weeks had pain again.    So then prednisone  And off   Advised  remicade but decided to stayed off rx   18 months no issue  Using probiotic.  .  Doing ok without sig sx at this time No fam hx but lactose    Sis had a colitis .  Marland Kitchen    One has gluten intoleracne ? Neg celiac .  Some iron deficincy  Parents good health but pos elevated lipids on lipitor father.  ROS: See pertinent positives and negatives per HPI. recenet headaches  Recently   Eye sight    Wears contacts. Tingling in fingers .   Works vp Scientist, research (physical sciences)  No feet tingling   Past Medical History  Diagnosis Date  . Crohn's disease (HCC)   . Hx of varicella   . Drug reaction     [facility] in 2014  . Crohn disease (HCC)     last colonoscopy 2014 dr Loreta Ave    Family History  Problem Relation Age of Onset  . Hyperlipidemia Father   . Lactose intolerance Sister     gluten sensitiviey not celiac  . Lactose intolerance Son     Social History   Social History  . Marital Status: Married    Spouse Name: N/A  . Number of Children: N/A  . Years of Education: N/A   Occupational History  . VP    Social History Main Topics  . Smoking status: Never Smoker   . Smokeless tobacco: None  . Alcohol Use: 0.0 oz/week    0 Standard drinks or equivalent per week     Comment: occasional 1-2 weekly  . Drug Use: No  . Sexual  Activity: Not Asked   Other Topics Concern  . 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    Neg td 1-2 etoh per week or less   Reg exercise aerobic and weights        Outpatient Prescriptions Prior to Visit  Medication Sig Dispense Refill  . fish oil-omega-3 fatty acids 1000 MG capsule Take 2 g by mouth daily.    Marland Kitchen triamcinolone (NASACORT) 55 MCG/ACT nasal inhaler Place 2 sprays into the nose daily as needed (for allergies).    . predniSONE (DELTASONE) 20 MG tablet Take 2 tablets (40 mg total) by mouth daily. 40 tablet 0  . traMADol (ULTRAM) 50 MG tablet Take 50 mg by  mouth every 6 (six) hours as needed for pain.     No facility-administered medications prior to visit.     EXAM:  BP 104/70 mmHg  Temp(Src) 98 F (36.7 C) (Oral)  Ht 5' 5.5" (1.664 m)  Wt 162 lb 9.6 oz (73.755 kg)  BMI 26.64 kg/m2  Body mass index is 26.64 kg/(m^2). Physical Exam: Vital signs reviewed [id number] is a well-developed well-nourished alert cooperative  male  who appears   stated age in no acute distress.  HEENT: normocephalic  traumatic , Eyes: PERRL EOM's full, conjunctiva clear, Nares: patent no deformity discharge or tenderness., Ears: no deformity EAC's clear TMs with normal landmarks. Mouth: clear OP, no lesions, edema.  Moist mucous membranes. Dentition in adequate repair. NECK: supple without masses, thyromegaly or bruits. CHEST/PULM:  Clear to auscultation and percussion breath sounds equal no wheeze , rales or rhonchi. No chest wall deformities or tenderness. CV: PMI is nondisplaced, S1 S2 no gallops, murmurs, rubs. Peripheral pulses are full without delay.No JVD .  ABDOMEN: Bowel sounds normal nontender  No guard or rebound, no hepato splenomegal no CVA tenderness.  Extremtities:   No clubbing cyanosis or edema, no acute joint swelling or redness no focal atrophy NEURO:  Oriented x3, cranial nerves 3-12 appear to be intact, no obvious focal weakness,gait within normal limits no abnormal reflexes or asymmetrical SKIN: No acute rashes normal turgor, color, no bruising or petechiae. PSYCH: Oriented, good eye contact, no obvious depression anxiety, cognition and judgment appear normal. LN:  No cervical axillary or inguinal adenopathy   ASSESSMENT AND PLAN:  Discussed the following assessment and plan:  Visit for preventive health examination - Plan: Basic metabolic panel, CBC with Differential/Platelet, Hepatic function panel, Lipid panel, TSH  Crohn disease, without complications  Encounter for immunization  -Patient advised to return or notify health care team  if symptoms worsen ,persist or new concerns arise.  Patient Instructions  Will notify you  of labs when available. Can sign up for my chart .  Mediterranean diet is the most heart healthy .  Get  Release to get dr Loreta Ave to send recent records and ongoing visits   Health Maintenance, Male A healthy lifestyle and preventative care can promote health and wellness.  Maintain regular health, dental, and eye exams.  Eat a healthy diet. Foods like vegetables, fruits, whole grains, low-fat dairy products, and lean protein foods contain the nutrients you need and are low in calories. Decrease your intake of foods high in solid fats, added sugars, and salt. Get information about a proper diet from your health care provider, if necessary.  Regular physical exercise is one of the most important things you can do for your health. Most adults should get at least 150 minutes of moderate-intensity exercise (any activity that increases your heart rate and causes you to sweat) each week. In addition, most adults need muscle-strengthening exercises on 2 or more days a week.   Maintain a healthy weight. The body mass index  (BMI) is a screening tool to identify possible weight problems. It provides an estimate of body fat based on height and weight. Your health care provider can find your BMI and can help you achieve or maintain a healthy weight. For males 20 years and older:  A BMI below 18.5 is considered underweight.  A BMI of 18.5 to 24.9 is normal.  A BMI of 25 to 29.9 is considered overweight.  A BMI of 30 and above is considered obese.  Maintain normal blood  lipids and cholesterol by exercising and minimizing your intake of saturated fat. Eat a balanced diet with plenty of fruits and vegetables. Blood tests for lipids and cholesterol should begin at age 13 and be repeated every 5 years. If your lipid or cholesterol levels are high, you are over age 22, or you are at high risk for heart disease, you may need your cholesterol levels checked more frequently.Ongoing high lipid and cholesterol levels should be treated with medicines if diet and exercise are not working.  If you smoke, find out from your health care provider how to quit. If you do not use tobacco, do not start.  Lung cancer screening is recommended for adults aged 55-80 years who are at high risk for developing lung cancer because of a history of smoking. A yearly low-dose CT scan of the lungs is recommended for people who have at least a 30-pack-year history of smoking and are current smokers or have quit within the past 15 years. A pack year of smoking is smoking an average of 1 pack of cigarettes a day for 1 year (for example, a 30-pack-year history of smoking could mean smoking 1 pack a day for 30 years or 2 packs a day for 15 years). Yearly screening should continue until the smoker has stopped smoking for at least 15 years. Yearly screening should be stopped for people who develop a health problem that would prevent them from having lung cancer treatment.  If you choose to drink alcohol, do not have more than 2 drinks per day. One drink is  considered to be 12 oz (360 mL) of beer, 5 oz (150 mL) of wine, or 1.5 oz (45 mL) of liquor.  Avoid the use of street drugs. Do not share needles with anyone. Ask for help if you need support or instructions about stopping the use of drugs.  High blood pressure causes heart disease and increases the risk of stroke. High blood pressure is more likely to develop in:  People who have blood pressure in the end of the normal range (100-139/85-89 mm Hg).  People who are overweight or obese.  People who are African American.  If you are 49-12 years of age, have your blood pressure checked every 3-5 years. If you are 70 years of age or older, have your blood pressure checked every year. You should have your blood pressure measured twice--once when you are at a hospital or clinic, and once when you are not at a hospital or clinic. Record the average of the two measurements. To check your blood pressure when you are not at a hospital or clinic, you can use:  An automated blood pressure machine at a pharmacy.  A home blood pressure monitor.  If you are 62-69 years old, ask your health care provider if you should take aspirin to prevent heart disease.  Diabetes screening involves taking a blood sample to check your fasting blood sugar level. This should be done once every 3 years after age 103 if you are at a normal weight and without risk factors for diabetes. Testing should be considered at a younger age or be carried out more frequently if you are overweight and have at least 1 risk factor for diabetes.  Colorectal cancer can be detected and often prevented. Most routine colorectal cancer screening begins at the age of 42 and continues through age 60. However, your health care provider may recommend screening at an earlier age if you have risk factors for colon cancer.  On a yearly basis, your health care provider may provide home test kits to check for hidden blood in the stool. A small camera at the end  of a tube may be used to directly examine the colon (sigmoidoscopy or colonoscopy) to detect the earliest forms of colorectal cancer. Talk to your health care provider about this at age 9 when routine screening begins. A direct exam of the colon should be repeated every 5-10 years through age 56, unless early forms of precancerous polyps or small growths are found.  People who are at an increased risk for hepatitis B should be screened for this virus. You are considered at high risk for hepatitis B if:  You were born in a country where hepatitis B occurs often. Talk with your health care provider about which countries are considered high risk.  Your parents were born in a high-risk country and you have not received a shot to protect against hepatitis B (hepatitis B vaccine).  You have HIV or AIDS.  You use needles to inject street drugs.  You live with, or have sex with, someone who has hepatitis B.  You are a man who has sex with other men (MSM).  You get hemodialysis treatment.  You take certain medicines for conditions like cancer, organ transplantation, and autoimmune conditions.  Hepatitis C blood testing is recommended for all people born from [birth year] through 1966 and any individual with known risk factors for hepatitis C.  Healthy men should no longer receive prostate-specific antigen (PSA) blood tests as part of routine cancer screening. Talk to your health care provider about prostate cancer screening.  Testicular cancer screening is not recommended for adolescents or adult males who have no symptoms. Screening includes self-exam, a health care provider exam, and other screening tests. Consult with your health care provider about any symptoms you have or any concerns you have about testicular cancer.  Practice safe sex. Use condoms and avoid high-risk sexual practices to reduce the spread of sexually transmitted infections (STIs).  You should be screened for STIs, including  gonorrhea and chlamydia if:  You are sexually active and are younger than 24 years.  You are older than 24 years, and your health care provider tells you that you are at risk for this type of infection.  Your sexual activity has changed since you were last screened, and you are at an increased risk for chlamydia or gonorrhea. Ask your health care provider if you are at risk.  If you are at risk of being infected with HIV, it is recommended that you take a prescription medicine daily to prevent HIV infection. This is called pre-exposure prophylaxis (PrEP). You are considered at risk if:  You are a man who has sex with other men (MSM).  You are a heterosexual man who is sexually active with multiple partners.  You take drugs by injection.  You are sexually active with a partner who has HIV.  Talk with your health care provider about whether you are at high risk of being infected with HIV. If you choose to begin PrEP, you should first be tested for HIV. You should then be tested every 3 months for as long as you are taking PrEP.  Use sunscreen. Apply sunscreen liberally and repeatedly throughout the day. You should seek shade when your shadow is shorter than you. Protect yourself by wearing long sleeves, pants, a wide-brimmed hat, and sunglasses year round whenever you are outdoors.  Tell your health care provider of new  moles or changes in moles, especially if there is a change in shape or color. Also, tell your health care provider if a mole is larger than the size of a pencil eraser.  A one-time screening for abdominal aortic aneurysm (AAA) and surgical repair of large AAAs by ultrasound is recommended for men aged 65-75 years who are current or former smokers.  Stay current with your vaccines (immunizations).   This information is not intended to replace advice given to you by your health care provider. Make sure you discuss any questions you have with your health care provider.    Document Released: 06/27/2007 Document Revised: 01/19/2014 Document Reviewed: 05/26/2010 Elsevier Interactive Patient Education [employer]      [place]. Lachelle Rissler M.D.

## 2014-10-27 ENCOUNTER — Encounter: Payer: Self-pay | Admitting: Internal Medicine

## 2015-05-08 DIAGNOSIS — D485 Neoplasm of uncertain behavior of skin: Secondary | ICD-10-CM | POA: Diagnosis not present

## 2015-05-08 DIAGNOSIS — B353 Tinea pedis: Secondary | ICD-10-CM | POA: Diagnosis not present

## 2015-05-22 DIAGNOSIS — D485 Neoplasm of uncertain behavior of skin: Secondary | ICD-10-CM | POA: Diagnosis not present

## 2015-11-26 DIAGNOSIS — M25541 Pain in joints of right hand: Secondary | ICD-10-CM | POA: Diagnosis not present

## 2015-11-26 DIAGNOSIS — K5010 Crohn's disease of large intestine without complications: Secondary | ICD-10-CM | POA: Diagnosis not present

## 2015-11-26 DIAGNOSIS — M545 Low back pain: Secondary | ICD-10-CM | POA: Diagnosis not present

## 2015-11-26 DIAGNOSIS — M25542 Pain in joints of left hand: Secondary | ICD-10-CM | POA: Diagnosis not present

## 2016-01-28 DIAGNOSIS — Z23 Encounter for immunization: Secondary | ICD-10-CM | POA: Diagnosis not present

## 2016-04-27 DIAGNOSIS — M9902 Segmental and somatic dysfunction of thoracic region: Secondary | ICD-10-CM | POA: Diagnosis not present

## 2016-04-27 DIAGNOSIS — M546 Pain in thoracic spine: Secondary | ICD-10-CM | POA: Diagnosis not present

## 2016-04-27 DIAGNOSIS — M545 Low back pain: Secondary | ICD-10-CM | POA: Diagnosis not present

## 2016-04-27 DIAGNOSIS — M542 Cervicalgia: Secondary | ICD-10-CM | POA: Diagnosis not present

## 2016-05-26 NOTE — Progress Notes (Signed)
Chief Complaint  Patient presents with  . Annual Exam    HPI: Patient  Jesus Lambert  41 y.o. comes in today for Preventive Health Care visit  Last visit 2016 pv  Hs crohns  Seems quiescent  Going on trop boat [place] for week asks ab out med for Motion sickness  Health Maintenance  Topic Date Due  . Jesus Lambert  12[birth year]1993  . HIV Screening  05/27/2017 (Originally 12/29/1989)  . INFLUENZA VACCINE  08/12/2016   Health Maintenance Review LIFESTYLE:  Exercise:  Reg  4 x per week no limtations  Tobacco/ETS:n Alcohol:  4-5 per week Sugar beverages:n Sleep: 6-8 hours  Drug use: no HH of  4 no pets Work:40-50    ROS:  recnet neck crick no major injury no neuro sx  GEN/ HEENT: No fever, significant weight changes sweats headaches vision problems hearing changes, CV/ PULM; No chest pain shortness of breath cough, syncope,edema  change in exercise tolerance. GI /GU: No adominal pain, vomiting, change in bowel habits. No blood in the stool. No significant GU symptoms. SKIN/HEME: ,no acute skin rashes suspicious lesions or bleeding. No lymphadenopathy, nodules, masses.  NEURO/ PSYCH:  No neurologic signs such as weakness numbness. No depression anxiety. IMM/ Allergy: No unusual infections.  Allergy .   REST of 12 system review negative except as per HPI   Past Medical History:  Diagnosis Date  . Crohn disease (HCC)    last colonoscopy 2014 dr Loreta Ave  . Crohn's disease (HCC)   . Drug reaction    [facility]  . Hx of varicella     Past Surgical History:  Procedure Laterality Date  . APPENDECTOMY  1986  . ARTHROSCOPIC REPAIR ACL  2005   left  sports   . SHOULDER SURGERY  1999   left lifting    Family History  Problem Relation Age of Onset  . Hyperlipidemia Father   . Lactose intolerance Sister        gluten sensitiviey not celiac  . Lactose intolerance Son     Social History   Social History  . Marital status: Married    Spouse name: N/A  .  Number of children: N/A  . Years of education: N/A   Occupational History  . VP    Social History Main Topics  . Smoking status: Never Smoker  . Smokeless tobacco: Former Neurosurgeon    Types: Chew  . Alcohol use 0.0 oz/week     Comment: occasional 1-2 weekly  . Drug use: No  . Sexual activity: Not Asked   Other Topics Concern  . 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    Neg td 1-2 etoh per week or less   Reg exercise aerobic and weights        Outpatient Medications Prior to Visit  Medication Sig Dispense Refill  . fish oil-omega-3 fatty acids 1000 MG capsule Take 2 g by mouth daily.    Marland Kitchen triamcinolone (NASACORT) 55 MCG/ACT nasal inhaler Place 2 sprays into the nose daily as needed (for allergies).     No facility-administered medications prior to visit.      EXAM:  BP 98/60 (BP Location: Right Arm, Patient  Position: Sitting, Cuff Size: Normal)   Pulse 60   Temp 98.1 F (36.7 C) (Oral)   Ht 5' 5.5" (1.664 m)   Wt 154 lb 12.8 oz (70.2 kg)   BMI 25.37 kg/m   Body mass index is 25.37 kg/m. Wt Readings from Last 3 Encounters:  05/27/16 154 lb 12.8 oz (70.2 kg)  10/24/14 162 lb 9.6 oz (73.8 kg)  06/11/12 142 lb 9.6 oz (64.7 kg)    Physical Exam: Vital signs reviewed NWG:NFAO is a well-developed well-nourished alert cooperative    who appearsr stated age in no acute distress.  HEENT: normocephalic atraumatic , Eyes: PERRL EOM's full, conjunctiva clear, Nares: paten,t no deformity discharge or tenderness., Ears: no deformity EAC's clear TMs with normal landmarks. Mouth: clear OP, no lesions, edema.  Moist mucous membranes. Dentition in adequate repair. NECK: supple without masses, thyromegaly or bruits. CHEST/PULM:  Clear to auscultation and percussion breath  sounds equal no wheeze , rales or rhonchi. No chest wall deformities or tenderness. Breast: normal by inspection . No dimpling, discharge, masses, tenderness or discharge . CV: PMI is nondisplaced, S1 S2 no gallops, murmurs, rubs. Peripheral pulses are full without delay.No JVD .  ABDOMEN: Bowel sounds normal nontender  No guard or rebound, no hepato splenomegal no CVA tenderness.  No hernia. Extremtities:  No clubbing cyanosis or edema, no acute joint swelling or redness no focal atrophy NEURO:  Oriented x3, cranial nerves 3-12 appear to be intact, no obvious focal weakness,gait within normal limits no abnormal reflexes or asymmetrical SKIN: No acute rashes normal turgor, color, no bruising or petechiae. PSYCH: Oriented, good eye contact, no obvious depression anxiety, cognition and judgment appear normal. LN: no cervical axillary inguinal adenopathy  Lab Results  Component Value Date   WBC 5.6 05/27/2016   HGB 13.8 05/27/2016   HCT 41.4 05/27/2016   PLT 193.0 05/27/2016   GLUCOSE 71 05/27/2016   CHOL 205 (H) 05/27/2016   TRIG 72.0 05/27/2016   HDL 44.20 05/27/2016   LDLCALC 146 (H) 05/27/2016   ALT 12 05/27/2016   AST 16 05/27/2016   NA 139 05/27/2016   K 4.0 05/27/2016   CL 104 05/27/2016   CREATININE 1.19 05/27/2016   BUN 15 05/27/2016   CO2 28 05/27/2016   TSH 2.23 10/24/2014    BP Readings from Last 3 Encounters:  05/27/16 98/60  10/24/14 104/70  06/12/12 (!) 103/54   Wt Readings from Last 3 Encounters:  05/27/16 154 lb 12.8 oz (70.2 kg)  10/24/14 162 lb 9.6 oz (73.8 kg)  06/11/12 142 lb 9.6 oz (64.7 kg)     Lab results reviewed with patient   ASSESSMENT AND PLAN:  Discussed the following assessment and plan:  Visit for preventive health examination - Plan: CBC with Differential/Platelet, Lipid panel, Comprehensive metabolic panel  Crohn's disease without complication, unspecified gastrointestinal tract location (HCC) - Plan: CBC with Differential/Platelet,  Lipid panel, Comprehensive metabolic panel  Hyperlipidemia, unspecified hyperlipidemia type - Plan: CBC with Differential/Platelet, Lipid panel, Comprehensive metabolic panel  Need for prophylactic vaccination with tetanus-diphtheria (Td) - Plan: Td vaccine greater than or equal to 7yo preservative free IM  History of motion sickness - disc meds and prevention riks benefit sent in  Patient Care Team: Justice Milliron, Neta Mends, MD as PCP - General (Internal Medicine) Charna Elizabeth, MD as Consulting Physician (Gastroenterology) Cherlyn Roberts, MD as Consulting Physician (Dermatology) Antonietta Barcelona, OD as Referring Physician (Optometry) Patient Instructions  Will notify you  of labs when available. Td  today  Good for 10 years  consdier getting HEP a vaccine if not done since you travel.   Healthy lifestyle includes : At least 150 minutes of exercise weeks  , weight at healthy levels, which is usually   BMI 19-25. Avoid trans fats and processed foods;  Increase fresh fruits and veges to 5 servings per day. And avoid sweet beverages including tea and juice. Mediterranean diet with olive oil and nuts have been noted to be heart and brain healthy . Avoid tobacco products . Limit  alcohol to  7 per week for women and 14 servings for men.  Get adequate sleep . Wear seat belts . Don't text and drive .  Contact us if   Want rx for  Transderm  Patch for motion sickness .  Otherwise  Bonine and dramaine.  Make sure you get your checks with dr Loreta Ave.   If all lab ok then can do every other year wellness check until age  25  Or as needed    Health Maintenance, Male A healthy lifestyle and preventive care is important for your health and wellness. Ask your health care provider about what schedule of regular examinations is right for you. What should I know about weight and diet?  Eat a Healthy Diet  Eat plenty of vegetables, fruits, whole grains, low-fat dairy products, and lean protein.  Do  not eat a lot of foods high in solid fats, added sugars, or salt. Maintain a Healthy Weight  Regular exercise can help you achieve or maintain a healthy weight. You should:  Do at least 150 minutes of exercise each week. The exercise should increase your heart rate and make you sweat (moderate-intensity exercise).  Do strength-training exercises at least twice a week. Watch Your Levels of Cholesterol and Blood Lipids  Have your blood tested for lipids and cholesterol every 5 years starting at 42 years of age. If you are at high risk for heart disease, you should start having your blood tested when you are 42 years old. You may need to have your cholesterol levels checked more often if:  Your lipid or cholesterol levels are high.  You are older than 42 years of age.  You are at high risk for heart disease. What should I know about cancer screening? Many types of cancers can be detected early and may often be prevented. Lung Cancer  You should be screened every year for lung cancer if:  You are a current smoker who has smoked for at least 30 years.  You are a former smoker who has quit within the past 15 years.  Talk to your health care provider about your screening options, when you should start screening, and how often you should be screened. Colorectal Cancer  Routine colorectal cancer screening usually begins at 42 years of age and should be repeated every 5-10 years until you are 42 years old. You may need to be screened more often if early forms of precancerous polyps or small growths are found. Your health care provider may recommend screening at an earlier age if you have risk factors for colon cancer.  Your health care provider may recommend using home test kits to check for hidden blood in the stool.  A small camera at the end of a tube can be used to examine your colon (sigmoidoscopy or colonoscopy). This checks for the earliest forms of colorectal cancer. Prostate and  Testicular Cancer  Depending on your age and overall health, your health  care provider may do certain tests to screen for prostate and testicular cancer.  Talk to your health care provider about any symptoms or concerns you have about testicular or prostate cancer. Skin Cancer  Check your skin from head to toe regularly.  Tell your health care provider about any new moles or changes in moles, especially if:  There is a change in a mole's size, shape, or color.  You have a mole that is larger than a pencil eraser.  Always use sunscreen. Apply sunscreen liberally and repeat throughout the day.  Protect yourself by wearing long sleeves, pants, a wide-brimmed hat, and sunglasses when outside. What should I know about heart disease, diabetes, and high blood pressure?  If you are 39-41 years of age, have your blood pressure checked every 3-5 years. If you are 59 years of age or older, have your blood pressure checked every year. You should have your blood pressure measured twice-once when you are at a hospital or clinic, and once when you are not at a hospital or clinic. Record the average of the two measurements. To check your blood pressure when you are not at a hospital or clinic, you can use:  An automated blood pressure machine at a pharmacy.  A home blood pressure monitor.  Talk to your health care provider about your target blood pressure.  If you are between 2-48 years old, ask your health care provider if you should take aspirin to prevent heart disease.  Have regular diabetes screenings by checking your fasting blood sugar level.  If you are at a normal weight and have a low risk for diabetes, have this test once every three years after the age of 57.  If you are overweight and have a high risk for diabetes, consider being tested at a younger age or more often.  A one-time screening for abdominal aortic aneurysm (AAA) by ultrasound is recommended for men aged 65-75 years who  are current or former smokers. What should I know about preventing infection? Hepatitis B  If you have a higher risk for hepatitis B, you should be screened for this virus. Talk with your health care provider to find out if you are at risk for hepatitis B infection. Hepatitis C  Blood testing is recommended for:  Everyone born from [birth year] through 1966.  Anyone with known risk factors for hepatitis C. Sexually Transmitted Diseases (STDs)  You should be screened each year for STDs including gonorrhea and chlamydia if:  You are sexually active and are younger than 42 years of age.  You are older than 42 years of age and your health care provider tells you that you are at risk for this type of infection.  Your sexual activity has changed since you were last screened and you are at an increased risk for chlamydia or gonorrhea. Ask your health care provider if you are at risk.  Talk with your health care provider about whether you are at high risk of being infected with HIV. Your health care provider may recommend a prescription medicine to help prevent HIV infection. What else can I do?  Schedule regular health, dental, and eye exams.  Stay current with your vaccines (immunizations).  Do not use any tobacco products, such as cigarettes, chewing tobacco, and e-cigarettes. If you need help quitting, ask your health care provider.  Limit alcohol intake to no more than 2 drinks per day. One drink equals 12 ounces of beer, 5 ounces of wine, or 1 ounces  of hard liquor.  Do not use street drugs.  Do not share needles.  Ask your health care provider for help if you need support or information about quitting drugs.  Tell your health care provider if you often feel depressed.  Tell your health care provider if you have ever been abused or do not feel safe at home. This information is not intended to replace advice given to you by your health care provider. Make sure you discuss any questions  you have with your health care provider. Document Released: 06/27/2007 Document Revised: 08/28/2015 Document Reviewed: 10/02/2014 Elsevier Interactive Patient Education  2017 [employer].    [place]. Hall Birchard M.D.

## 2016-05-27 ENCOUNTER — Encounter: Payer: Self-pay | Admitting: Internal Medicine

## 2016-05-27 ENCOUNTER — Ambulatory Visit (INDEPENDENT_AMBULATORY_CARE_PROVIDER_SITE_OTHER): Payer: BLUE CROSS/BLUE SHIELD | Admitting: Internal Medicine

## 2016-05-27 VITALS — BP 98/60 | HR 60 | Temp 98.1°F | Ht 65.5 in | Wt 154.8 lb

## 2016-05-27 DIAGNOSIS — K5090 Crohn's disease, unspecified, without complications: Secondary | ICD-10-CM | POA: Diagnosis not present

## 2016-05-27 DIAGNOSIS — Z87898 Personal history of other specified conditions: Secondary | ICD-10-CM | POA: Diagnosis not present

## 2016-05-27 DIAGNOSIS — E785 Hyperlipidemia, unspecified: Secondary | ICD-10-CM | POA: Diagnosis not present

## 2016-05-27 DIAGNOSIS — Z23 Encounter for immunization: Secondary | ICD-10-CM | POA: Diagnosis not present

## 2016-05-27 DIAGNOSIS — Z0000 Encounter for general adult medical examination without abnormal findings: Secondary | ICD-10-CM

## 2016-05-27 LAB — CBC WITH DIFFERENTIAL/PLATELET
BASOS ABS: 0 10*3/uL (ref 0.0–0.1)
Basophils Relative: 0.4 % (ref 0.0–3.0)
EOS PCT: 2.3 % (ref 0.0–5.0)
Eosinophils Absolute: 0.1 10*3/uL (ref 0.0–0.7)
HCT: 41.4 % (ref 39.0–52.0)
Hemoglobin: 13.8 g/dL (ref 13.0–17.0)
LYMPHS ABS: 2.2 10*3/uL (ref 0.7–4.0)
LYMPHS PCT: 39.4 % (ref 12.0–46.0)
MCHC: 33.4 g/dL (ref 30.0–36.0)
MCV: 89.4 fl (ref 78.0–100.0)
Monocytes Absolute: 0.4 10*3/uL (ref 0.1–1.0)
Monocytes Relative: 7.9 % (ref 3.0–12.0)
NEUTROS ABS: 2.8 10*3/uL (ref 1.4–7.7)
NEUTROS PCT: 50 % (ref 43.0–77.0)
Platelets: 193 10*3/uL (ref 150.0–400.0)
RBC: 4.63 Mil/uL (ref 4.22–5.81)
RDW: 13.5 % (ref 11.5–15.5)
WBC: 5.6 10*3/uL (ref 4.0–10.5)

## 2016-05-27 LAB — COMPREHENSIVE METABOLIC PANEL
ALBUMIN: 4.8 g/dL (ref 3.5–5.2)
ALT: 12 U/L (ref 0–53)
AST: 16 U/L (ref 0–37)
Alkaline Phosphatase: 38 U/L — ABNORMAL LOW (ref 39–117)
BILIRUBIN TOTAL: 0.7 mg/dL (ref 0.2–1.2)
BUN: 15 mg/dL (ref 6–23)
CALCIUM: 9.4 mg/dL (ref 8.4–10.5)
CO2: 28 mEq/L (ref 19–32)
Chloride: 104 mEq/L (ref 96–112)
Creatinine, Ser: 1.19 mg/dL (ref 0.40–1.50)
GFR: 71.46 mL/min (ref >60.00)
Glucose, Bld: 71 mg/dL (ref 70–99)
Potassium: 4 mEq/L (ref 3.5–5.1)
SODIUM: 139 meq/L (ref 135–145)
TOTAL PROTEIN: 7.3 g/dL (ref 6.0–8.3)

## 2016-05-27 LAB — LIPID PANEL
Cholesterol: 205 mg/dL — ABNORMAL HIGH (ref 0–200)
HDL: 44.2 mg/dL (ref >39.00)
LDL CALC: 146 mg/dL — AB (ref 0–99)
NONHDL: 160.51
Total CHOL/HDL Ratio: 5
Triglycerides: 72 mg/dL (ref 0.0–149.0)
VLDL: 14.4 mg/dL (ref 0.0–40.0)

## 2016-05-27 MED ORDER — SCOPOLAMINE 1 MG/3DAYS TD PT72: 1.0000 | MEDICATED_PATCH | TRANSDERMAL | 1 refills | Status: DC

## 2016-05-27 NOTE — Patient Instructions (Addendum)
Will notify you  of labs when available. Td today  Good for 10 years  consdier getting HEP a vaccine if not done since you travel.   Healthy lifestyle includes : At least 150 minutes of exercise weeks  , weight at healthy levels, which is usually   BMI 19-25. Avoid trans fats and processed foods;  Increase fresh fruits and veges to 5 servings per day. And avoid sweet beverages including tea and juice. Mediterranean diet with olive oil and nuts have been noted to be heart and brain healthy . Avoid tobacco products . Limit  alcohol to  7 per week for women and 14 servings for men.  Get adequate sleep . Wear seat belts . Don't text and drive .  Contact us if   Want rx for  Transderm  Patch for motion sickness .  Otherwise  Bonine and dramaine.  Make sure you get your checks with dr Loreta Ave.   If all lab ok then can do every other year wellness check until age  63  Or as needed    Health Maintenance, Male A healthy lifestyle and preventive care is important for your health and wellness. Ask your health care provider about what schedule of regular examinations is right for you. What should I know about weight and diet?  Eat a Healthy Diet  Eat plenty of vegetables, fruits, whole grains, low-fat dairy products, and lean protein.  Do not eat a lot of foods high in solid fats, added sugars, or salt. Maintain a Healthy Weight  Regular exercise can help you achieve or maintain a healthy weight. You should:  Do at least 150 minutes of exercise each week. The exercise should increase your heart rate and make you sweat (moderate-intensity exercise).  Do strength-training exercises at least twice a week. Watch Your Levels of Cholesterol and Blood Lipids  Have your blood tested for lipids and cholesterol every 5 years starting at 42 years of age. If you are at high risk for heart disease, you should start having your blood tested when you are 42 years old. You may need to have your cholesterol  levels checked more often if:  Your lipid or cholesterol levels are high.  You are older than 42 years of age.  You are at high risk for heart disease. What should I know about cancer screening? Many types of cancers can be detected early and may often be prevented. Lung Cancer  You should be screened every year for lung cancer if:  You are a current smoker who has smoked for at least 30 years.  You are a former smoker who has quit within the past 15 years.  Talk to your health care provider about your screening options, when you should start screening, and how often you should be screened. Colorectal Cancer  Routine colorectal cancer screening usually begins at 42 years of age and should be repeated every 5-10 years until you are 42 years old. You may need to be screened more often if early forms of precancerous polyps or small growths are found. Your health care provider may recommend screening at an earlier age if you have risk factors for colon cancer.  Your health care provider may recommend using home test kits to check for hidden blood in the stool.  A small camera at the end of a tube can be used to examine your colon (sigmoidoscopy or colonoscopy). This checks for the earliest forms of colorectal cancer. Prostate and Testicular Cancer  Depending  on your age and overall health, your health care provider may do certain tests to screen for prostate and testicular cancer.  Talk to your health care provider about any symptoms or concerns you have about testicular or prostate cancer. Skin Cancer  Check your skin from head to toe regularly.  Tell your health care provider about any new moles or changes in moles, especially if:  There is a change in a mole's size, shape, or color.  You have a mole that is larger than a pencil eraser.  Always use sunscreen. Apply sunscreen liberally and repeat throughout the day.  Protect yourself by wearing long sleeves, pants, a  wide-brimmed hat, and sunglasses when outside. What should I know about heart disease, diabetes, and high blood pressure?  If you are 49-53 years of age, have your blood pressure checked every 3-5 years. If you are 28 years of age or older, have your blood pressure checked every year. You should have your blood pressure measured twice-once when you are at a hospital or clinic, and once when you are not at a hospital or clinic. Record the average of the two measurements. To check your blood pressure when you are not at a hospital or clinic, you can use:  An automated blood pressure machine at a pharmacy.  A home blood pressure monitor.  Talk to your health care provider about your target blood pressure.  If you are between 25-89 years old, ask your health care provider if you should take aspirin to prevent heart disease.  Have regular diabetes screenings by checking your fasting blood sugar level.  If you are at a normal weight and have a low risk for diabetes, have this test once every three years after the age of 94.  If you are overweight and have a high risk for diabetes, consider being tested at a younger age or more often.  A one-time screening for abdominal aortic aneurysm (AAA) by ultrasound is recommended for men aged 65-75 years who are current or former smokers. What should I know about preventing infection? Hepatitis B  If you have a higher risk for hepatitis B, you should be screened for this virus. Talk with your health care provider to find out if you are at risk for hepatitis B infection. Hepatitis C  Blood testing is recommended for:  Everyone born from [birth year] through 1966.  Anyone with known risk factors for hepatitis C. Sexually Transmitted Diseases (STDs)  You should be screened each year for STDs including gonorrhea and chlamydia if:  You are sexually active and are younger than 42 years of age.  You are older than 42 years of age and your health care provider  tells you that you are at risk for this type of infection.  Your sexual activity has changed since you were last screened and you are at an increased risk for chlamydia or gonorrhea. Ask your health care provider if you are at risk.  Talk with your health care provider about whether you are at high risk of being infected with HIV. Your health care provider may recommend a prescription medicine to help prevent HIV infection. What else can I do?  Schedule regular health, dental, and eye exams.  Stay current with your vaccines (immunizations).  Do not use any tobacco products, such as cigarettes, chewing tobacco, and e-cigarettes. If you need help quitting, ask your health care provider.  Limit alcohol intake to no more than 2 drinks per day. One drink equals 12 ounces  of beer, 5 ounces of wine, or 1 ounces of hard liquor.  Do not use street drugs.  Do not share needles.  Ask your health care provider for help if you need support or information about quitting drugs.  Tell your health care provider if you often feel depressed.  Tell your health care provider if you have ever been abused or do not feel safe at home. This information is not intended to replace advice given to you by your health care provider. Make sure you discuss any questions you have with your health care provider. Document Released: 06/27/2007 Document Revised: 08/28/2015 Document Reviewed: 10/02/2014 Elsevier Interactive Patient Education  2017 [employer].

## 2016-08-27 DIAGNOSIS — K5010 Crohn's disease of large intestine without complications: Secondary | ICD-10-CM | POA: Diagnosis not present

## 2016-08-27 DIAGNOSIS — M940 Chondrocostal junction syndrome [Tietze]: Secondary | ICD-10-CM | POA: Diagnosis not present

## 2016-08-27 DIAGNOSIS — R1012 Left upper quadrant pain: Secondary | ICD-10-CM | POA: Diagnosis not present

## 2016-12-29 DIAGNOSIS — H1045 Other chronic allergic conjunctivitis: Secondary | ICD-10-CM | POA: Diagnosis not present

## 2017-05-28 NOTE — Progress Notes (Signed)
Chief Complaint  Patient presents with  . Annual Exam    No new concerns    HPI: Patient  Jesus Lambert  42 y.o. comes in today for Preventive Health Care visit   crohns stable  In remission  No change in bowel habits  Sees dr Loreta Ave about every 2 years  waould like  Lipid check assessment   Health Maintenance  Topic Date Due  . HIV Screening  12/29/1989  . INFLUENZA VACCINE  08/12/2017  . TETANUS/TDAP  05/28/2026   Health Maintenance Review LIFESTYLE:  Exercise:   3-4 x per week .    Run 2.5 mile    wo cv pulm sx  Tobacco/ETS: no Alcohol:   1-2 per week Sugar beverages: no Sleep: 5-6 hours   Drug use: no   HH of   4   Work:  8-12   ROS:  [employer]   Numb in hand related to key boarding GEN/ HEENT: No fever, significant weight changes sweats headaches vision problems hearing changes, CV/ PULM; No chest pain shortness of breath cough, syncope,edema  change in exercise tolerance. GI /GU: No adominal pain, vomiting, change in bowel habits. No blood in the stool. No significant GU symptoms. SKIN/HEME: ,no acute skin rashes suspicious lesions or bleeding. No lymphadenopathy, nodules, masses.  NEURO/ PSYCH:  No neurologic signs such as weakness numbness. No depression anxiety. IMM/ Allergy: No unusual infections.  Allergy .   REST of 12 system review negative except as per HPI   Past Medical History:  Diagnosis Date  . Crohn disease (HCC)    last colonoscopy 2014 dr Loreta Ave  . Crohn's disease (HCC)   . Drug reaction    [facility]  . Hx of varicella     Past Surgical History:  Procedure Laterality Date  . APPENDECTOMY  1986  . ARTHROSCOPIC REPAIR ACL  2005   left  sports   . SHOULDER SURGERY  1999   left lifting    Family History  Problem Relation Age of Onset  . Hyperlipidemia Father   . Lactose intolerance Sister        gluten sensitiviey not celiac  . Lactose intolerance Son     Social History   Socioeconomic History  . Marital status:  Married    Spouse name: Not on file  . Number of children: Not on file  . Years of education: Not on file  . Highest education level: Not on file  Occupational History  . Occupation: VP  Social Needs  . Financial resource strain: Not on file  . Food insecurity:    Worry: Not on file    Inability: Not on file  . Transportation needs:    Medical: Not on file    Non-medical: Not on file  Tobacco Use  . Smoking status: Never Smoker  . Smokeless tobacco: Former Neurosurgeon    Types: Chew  Substance and Sexual Activity  . Alcohol use: Yes    Alcohol/week: 0.0 oz    Comment: occasional 1-2 weekly  . Drug use: No  . Sexual activity: Not on file  Lifestyle  . Physical activity:    Days per week: Not on file    Minutes per session: Not on file  . Stress: Not on file  Relationships  . Social connections:    Talks on phone: Not on file    Gets together: Not on file    Attends religious service: Not on file    Active  member of club or organization: Not on file    Attends meetings of clubs or organizations: Not on file    Relationship status: Not on file  Other Topics Concern  . 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    Neg td 1-2 etoh per week or less   Reg exercise aerobic and weights     Outpatient Medications Prior to Visit  Medication Sig Dispense Refill  . fish oil-omega-3 fatty acids 1000 MG capsule Take 2 g by mouth daily.    . Loratadine (CLARITIN PO) Take by mouth.    . Multiple Vitamins-Minerals (CENTRUM ADULTS PO) Take by mouth.    . Probiotic Product (PROBIOTIC PO) Take by mouth.    . triamcinolone (NASACORT) 55 MCG/ACT nasal inhaler Place 2 sprays into the nose daily as needed (for allergies).    Marland Kitchen scopolamine (TRANSDERM-SCOP, 1.5 MG,) 1 MG/3DAYS Place  1 patch (1.5 mg total) onto the skin every 3 (three) days. For motion sickness (Patient not taking: Reported on 05/31/2017) 4 patch 1   No facility-administered medications prior to visit.      EXAM:  BP 100/64 (BP Location: Right Arm, Patient Position: Sitting, Cuff Size: Normal)   Pulse 74   Temp 97.8 F (36.6 C) (Oral)   Ht 5' 5.5" (1.664 m)   Wt 151 lb 11.2 oz (68.8 kg)   BMI 24.86 kg/m   Body mass index is 24.86 kg/m. Wt Readings from Last 3 Encounters:  05/31/17 151 lb 11.2 oz (68.8 kg)  05/27/16 154 lb 12.8 oz (70.2 kg)  10/24/14 162 lb 9.6 oz (73.8 kg)    Physical Exam: Vital signs reviewed BCW:UGQB is a well-developed well-nourished alert cooperative    who appearsr stated age in no acute distress.  HEENT: normocephalic atraumatic , Eyes: PERRL EOM's full, conjunctiva clear, Nares: paten,t no deformity discharge or tenderness., Ears: no deformity EAC's clear TMs with normal landmarks. Mouth: clear OP, no lesions, edema.  Moist mucous membranes. Dentition in adequate repair. NECK: supple without masses, thyromegaly or bruits. CHEST/PULM:  Clear to auscultation and percussion breath sounds equal no wheeze , rales or rhonchi. No chest wall deformities or tenderness. . CV: PMI is nondisplaced, S1 S2 no gallops, murmurs, rubs. Peripheral pulses are full without delay.No JVD .    Hr  rr  50 rate  ABDOMEN: Bowel sounds normal nontender  No guard or rebound, no hepato splenomegal no CVA tenderness.   Extremtities:  No clubbing cyanosis or edema, no acute joint swelling or redness no focal atrophy NEURO:  Oriented x3, cranial nerves 3-12 appear to be intact, no obvious focal weakness,gait within normal limits no abnormal reflexes or asymmetrical SKIN: No acute rashes normal turgor, color, no bruising or petechiae. PSYCH: Oriented, good eye contact, no obvious depression anxiety, cognition and judgment appear normal. LN: no cervical axillary inguinal adenopathy  Lab Results    Component Value Date   WBC 5.6 05/27/2016   HGB 13.8 05/27/2016   HCT 41.4 05/27/2016   PLT 193.0 05/27/2016   GLUCOSE 71 05/27/2016   CHOL 205 (H) 05/27/2016   TRIG 72.0 05/27/2016   HDL 44.20 05/27/2016   LDLCALC 146 (H) 05/27/2016   ALT 12  05/27/2016   AST 16 05/27/2016   NA 139 05/27/2016   K 4.0 05/27/2016   CL 104 05/27/2016   CREATININE 1.19 05/27/2016   BUN 15 05/27/2016   CO2 28 05/27/2016   TSH 2.23 10/24/2014    BP Readings from Last 3 Encounters:  05/31/17 100/64  05/27/16 98/60  10/24/14 104/70    Lab results reviewed with patient   ASSESSMENT AND PLAN:  Discussed the following assessment and plan:  Visit for preventive health examination - Plan: Basic metabolic panel, CBC with Differential/Platelet, Hepatic function panel, Lipid panel, TSH  Hyperlipidemia, unspecified hyperlipidemia type - Plan: Basic metabolic panel, CBC with Differential/Platelet, Hepatic function panel, Lipid panel, TSH  Crohn's disease without complication, unspecified gastrointestinal tract location (HCC) - Plan: Basic metabolic panel, CBC with Differential/Platelet, Hepatic function panel, Lipid panel, TSH  Screening for HIV (human immunodeficiency virus) - low risk - Plan: HIV antibody Continue lifestyle intervention healthy eating and exercise . Lipid  Monitoring  No cv sx    Patient Care Team: Panosh, Neta Mends, MD as PCP - General (Internal Medicine) Charna Elizabeth, MD as Consulting Physician (Gastroenterology) Antonietta Barcelona, OD as Referring Physician (Optometry) Patient Instructions   Will notify you  of labs when available.  Continue lifestyle intervention healthy eating and exercise .    Health Maintenance, Male A healthy lifestyle and preventive care is important for your health and wellness. Ask your health care provider about what schedule of regular examinations is right for you. What should I know about weight and diet? Eat a Healthy Diet  Eat plenty of  vegetables, fruits, whole grains, low-fat dairy products, and lean protein.  Do not eat a lot of foods high in solid fats, added sugars, or salt.  Maintain a Healthy Weight Regular exercise can help you achieve or maintain a healthy weight. You should:  Do at least 150 minutes of exercise each week. The exercise should increase your heart rate and make you sweat (moderate-intensity exercise).  Do strength-training exercises at least twice a week.  Watch Your Levels of Cholesterol and Blood Lipids  Have your blood tested for lipids and cholesterol every 5 years starting at 43 years of age. If you are at high risk for heart disease, you should start having your blood tested when you are 43 years old. You may need to have your cholesterol levels checked more often if: ? Your lipid or cholesterol levels are high. ? You are older than 43 years of age. ? You are at high risk for heart disease.  What should I know about cancer screening? Many types of cancers can be detected early and may often be prevented. Lung Cancer  You should be screened every year for lung cancer if: ? You are a current smoker who has smoked for at least 30 years. ? You are a former smoker who has quit within the past 15 years.  Talk to your health care provider about your screening options, when you should start screening, and how often you should be screened.  Colorectal Cancer  Routine colorectal cancer screening usually begins at 43 years of age and should be repeated every 5-10 years until you are 43 years old. You may need to be screened more often if early forms of precancerous polyps or small growths are found. Your health care provider may recommend screening at an earlier age if you have risk factors for colon cancer.  Your health care provider may recommend using home test kits to  check for hidden blood in the stool.  A small camera at the end of a tube can be used to examine your colon (sigmoidoscopy or  colonoscopy). This checks for the earliest forms of colorectal cancer.  Prostate and Testicular Cancer  Depending on your age and overall health, your health care provider may do certain tests to screen for prostate and testicular cancer.  Talk to your health care provider about any symptoms or concerns you have about testicular or prostate cancer.  Skin Cancer  Check your skin from head to toe regularly.  Tell your health care provider about any new moles or changes in moles, especially if: ? There is a change in a mole's size, shape, or color. ? You have a mole that is larger than a pencil eraser.  Always use sunscreen. Apply sunscreen liberally and repeat throughout the day.  Protect yourself by wearing long sleeves, pants, a wide-brimmed hat, and sunglasses when outside.  What should I know about heart disease, diabetes, and high blood pressure?  If you are 49-60 years of age, have your blood pressure checked every 3-5 years. If you are 74 years of age or older, have your blood pressure checked every year. You should have your blood pressure measured twice-once when you are at a hospital or clinic, and once when you are not at a hospital or clinic. Record the average of the two measurements. To check your blood pressure when you are not at a hospital or clinic, you can use: ? An automated blood pressure machine at a pharmacy. ? A home blood pressure monitor.  Talk to your health care provider about your target blood pressure.  If you are between 58-44 years old, ask your health care provider if you should take aspirin to prevent heart disease.  Have regular diabetes screenings by checking your fasting blood sugar level. ? If you are at a normal weight and have a low risk for diabetes, have this test once every three years after the age of 70. ? If you are overweight and have a high risk for diabetes, consider being tested at a younger age or more often.  A one-time screening for  abdominal aortic aneurysm (AAA) by ultrasound is recommended for men aged 65-75 years who are current or former smokers. What should I know about preventing infection? Hepatitis B If you have a higher risk for hepatitis B, you should be screened for this virus. Talk with your health care provider to find out if you are at risk for hepatitis B infection. Hepatitis C Blood testing is recommended for:  Everyone born from [birth year] through 1965.  Anyone with known risk factors for hepatitis C.  Sexually Transmitted Diseases (STDs)  You should be screened each year for STDs including gonorrhea and chlamydia if: ? You are sexually active and are younger than 43 years of age. ? You are older than 43 years of age and your health care provider tells you that you are at risk for this type of infection. ? Your sexual activity has changed since you were last screened and you are at an increased risk for chlamydia or gonorrhea. Ask your health care provider if you are at risk.  Talk with your health care provider about whether you are at high risk of being infected with HIV. Your health care provider may recommend a prescription medicine to help prevent HIV infection.  What else can I do?  Schedule regular health, dental, and eye exams.  Stay current  with your vaccines (immunizations).  Do not use any tobacco products, such as cigarettes, chewing tobacco, and e-cigarettes. If you need help quitting, ask your health care provider.  Limit alcohol intake to no more than 2 drinks per day. One drink equals 12 ounces of beer, 5 ounces of wine, or 1 ounces of hard liquor.  Do not use street drugs.  Do not share needles.  Ask your health care provider for help if you need support or information about quitting drugs.  Tell your health care provider if you often feel depressed.  Tell your health care provider if you have ever been abused or do not feel safe at home. This information is not intended to  replace advice given to you by your health care provider. Make sure you discuss any questions you have with your health care provider. Document Released: 06/27/2007 Document Revised: 08/28/2015 Document Reviewed: 10/02/2014 Elsevier Interactive Patient Education  2018 [employer].    [place]. Panosh M.D.

## 2017-05-31 ENCOUNTER — Ambulatory Visit (INDEPENDENT_AMBULATORY_CARE_PROVIDER_SITE_OTHER): Payer: BLUE CROSS/BLUE SHIELD | Admitting: Internal Medicine

## 2017-05-31 ENCOUNTER — Encounter: Payer: Self-pay | Admitting: Internal Medicine

## 2017-05-31 VITALS — BP 100/64 | HR 74 | Temp 97.8°F | Ht 65.5 in | Wt 151.7 lb

## 2017-05-31 DIAGNOSIS — K5090 Crohn's disease, unspecified, without complications: Secondary | ICD-10-CM | POA: Diagnosis not present

## 2017-05-31 DIAGNOSIS — E785 Hyperlipidemia, unspecified: Secondary | ICD-10-CM | POA: Diagnosis not present

## 2017-05-31 DIAGNOSIS — Z114 Encounter for screening for human immunodeficiency virus [HIV]: Secondary | ICD-10-CM | POA: Diagnosis not present

## 2017-05-31 DIAGNOSIS — Z0000 Encounter for general adult medical examination without abnormal findings: Secondary | ICD-10-CM

## 2017-05-31 LAB — HEPATIC FUNCTION PANEL
ALT: 15 U/L (ref 0–53)
AST: 19 U/L (ref 0–37)
Albumin: 4.4 g/dL (ref 3.5–5.2)
Alkaline Phosphatase: 41 U/L (ref 39–117)
BILIRUBIN DIRECT: 0.1 mg/dL (ref 0.0–0.3)
BILIRUBIN TOTAL: 0.9 mg/dL (ref 0.2–1.2)
Total Protein: 7.2 g/dL (ref 6.0–8.3)

## 2017-05-31 LAB — CBC WITH DIFFERENTIAL/PLATELET
Basophils Absolute: 0.1 10*3/uL (ref 0.0–0.1)
Basophils Relative: 2 % (ref 0.0–3.0)
Eosinophils Absolute: 0.1 10*3/uL (ref 0.0–0.7)
Eosinophils Relative: 2.6 % (ref 0.0–5.0)
HCT: 41 % (ref 39.0–52.0)
HEMOGLOBIN: 13.8 g/dL (ref 13.0–17.0)
LYMPHS ABS: 1.9 10*3/uL (ref 0.7–4.0)
Lymphocytes Relative: 38.2 % (ref 12.0–46.0)
MCHC: 33.7 g/dL (ref 30.0–36.0)
MCV: 90 fl (ref 78.0–100.0)
MONO ABS: 0.3 10*3/uL (ref 0.1–1.0)
Monocytes Relative: 6.6 % (ref 3.0–12.0)
Neutro Abs: 2.5 10*3/uL (ref 1.4–7.7)
Neutrophils Relative %: 50.6 % (ref 43.0–77.0)
Platelets: 182 10*3/uL (ref 150.0–400.0)
RBC: 4.55 Mil/uL (ref 4.22–5.81)
RDW: 13.9 % (ref 11.5–15.5)
WBC: 4.9 10*3/uL (ref 4.0–10.5)

## 2017-05-31 LAB — LIPID PANEL
CHOL/HDL RATIO: 4
CHOLESTEROL: 245 mg/dL — AB (ref 0–200)
HDL: 57.7 mg/dL (ref >39.00)
LDL CALC: 165 mg/dL — AB (ref 0–99)
NonHDL: 186.94
TRIGLYCERIDES: 109 mg/dL (ref 0.0–149.0)
VLDL: 21.8 mg/dL (ref 0.0–40.0)

## 2017-05-31 LAB — BASIC METABOLIC PANEL
BUN: 15 mg/dL (ref 6–23)
CHLORIDE: 102 meq/L (ref 96–112)
CO2: 29 mEq/L (ref 19–32)
Calcium: 9.2 mg/dL (ref 8.4–10.5)
Creatinine, Ser: 1.16 mg/dL (ref 0.40–1.50)
GFR: 73.24 mL/min (ref >60.00)
Glucose, Bld: 76 mg/dL (ref 70–99)
POTASSIUM: 4 meq/L (ref 3.5–5.1)
Sodium: 139 mEq/L (ref 135–145)

## 2017-05-31 LAB — TSH: TSH: 2.4 u[IU]/mL (ref 0.35–4.50)

## 2017-05-31 NOTE — Patient Instructions (Signed)
Will notify you  of labs when available.    Continue lifestyle intervention healthy eating and exercise .    Health Maintenance, Male A healthy lifestyle and preventive care is important for your health and wellness. Ask your health care provider about what schedule of regular examinations is right for you. What should I know about weight and diet? Eat a Healthy Diet  Eat plenty of vegetables, fruits, whole grains, low-fat dairy products, and lean protein.  Do not eat a lot of foods high in solid fats, added sugars, or salt.  Maintain a Healthy Weight Regular exercise can help you achieve or maintain a healthy weight. You should:  Do at least 150 minutes of exercise each week. The exercise should increase your heart rate and make you sweat (moderate-intensity exercise).  Do strength-training exercises at least twice a week.  Watch Your Levels of Cholesterol and Blood Lipids  Have your blood tested for lipids and cholesterol every 5 years starting at 43 years of age. If you are at high risk for heart disease, you should start having your blood tested when you are 43 years old. You may need to have your cholesterol levels checked more often if: ? Your lipid or cholesterol levels are high. ? You are older than 43 years of age. ? You are at high risk for heart disease.  What should I know about cancer screening? Many types of cancers can be detected early and may often be prevented. Lung Cancer  You should be screened every year for lung cancer if: ? You are a current smoker who has smoked for at least 30 years. ? You are a former smoker who has quit within the past 15 years.  Talk to your health care provider about your screening options, when you should start screening, and how often you should be screened.  Colorectal Cancer  Routine colorectal cancer screening usually begins at 43 years of age and should be repeated every 5-10 years until you are 43 years old. You may need  to be screened more often if early forms of precancerous polyps or small growths are found. Your health care provider may recommend screening at an earlier age if you have risk factors for colon cancer.  Your health care provider may recommend using home test kits to check for hidden blood in the stool.  A small camera at the end of a tube can be used to examine your colon (sigmoidoscopy or colonoscopy). This checks for the earliest forms of colorectal cancer.  Prostate and Testicular Cancer  Depending on your age and overall health, your health care provider may do certain tests to screen for prostate and testicular cancer.  Talk to your health care provider about any symptoms or concerns you have about testicular or prostate cancer.  Skin Cancer  Check your skin from head to toe regularly.  Tell your health care provider about any new moles or changes in moles, especially if: ? There is a change in a mole's size, shape, or color. ? You have a mole that is larger than a pencil eraser.  Always use sunscreen. Apply sunscreen liberally and repeat throughout the day.  Protect yourself by wearing long sleeves, pants, a wide-brimmed hat, and sunglasses when outside.  What should I know about heart disease, diabetes, and high blood pressure?  If you are 18-39 years of age, have your blood pressure checked every 3-5 years. If you are 40 years of age or older, have your blood pressure   checked every year. You should have your blood pressure measured twice-once when you are at a hospital or clinic, and once when you are not at a hospital or clinic. Record the average of the two measurements. To check your blood pressure when you are not at a hospital or clinic, you can use: ? An automated blood pressure machine at a pharmacy. ? A home blood pressure monitor.  Talk to your health care provider about your target blood pressure.  If you are between 45-79 years old, ask your health care provider if  you should take aspirin to prevent heart disease.  Have regular diabetes screenings by checking your fasting blood sugar level. ? If you are at a normal weight and have a low risk for diabetes, have this test once every three years after the age of 45. ? If you are overweight and have a high risk for diabetes, consider being tested at a younger age or more often.  A one-time screening for abdominal aortic aneurysm (AAA) by ultrasound is recommended for men aged 65-75 years who are current or former smokers. What should I know about preventing infection? Hepatitis B If you have a higher risk for hepatitis B, you should be screened for this virus. Talk with your health care provider to find out if you are at risk for hepatitis B infection. Hepatitis C Blood testing is recommended for:  Everyone born from 1945 through 1965.  Anyone with known risk factors for hepatitis C.  Sexually Transmitted Diseases (STDs)  You should be screened each year for STDs including gonorrhea and chlamydia if: ? You are sexually active and are younger than 43 years of age. ? You are older than 43 years of age and your health care provider tells you that you are at risk for this type of infection. ? Your sexual activity has changed since you were last screened and you are at an increased risk for chlamydia or gonorrhea. Ask your health care provider if you are at risk.  Talk with your health care provider about whether you are at high risk of being infected with HIV. Your health care provider may recommend a prescription medicine to help prevent HIV infection.  What else can I do?  Schedule regular health, dental, and eye exams.  Stay current with your vaccines (immunizations).  Do not use any tobacco products, such as cigarettes, chewing tobacco, and e-cigarettes. If you need help quitting, ask your health care provider.  Limit alcohol intake to no more than 2 drinks per day. One drink equals 12 ounces of  beer, 5 ounces of wine, or 1 ounces of hard liquor.  Do not use street drugs.  Do not share needles.  Ask your health care provider for help if you need support or information about quitting drugs.  Tell your health care provider if you often feel depressed.  Tell your health care provider if you have ever been abused or do not feel safe at home. This information is not intended to replace advice given to you by your health care provider. Make sure you discuss any questions you have with your health care provider. Document Released: 06/27/2007 Document Revised: 08/28/2015 Document Reviewed: 10/02/2014 Elsevier Interactive Patient Education  2018 [employer] Inc[employer]        

## 2017-06-01 LAB — HIV ANTIBODY (ROUTINE TESTING W REFLEX): HIV: NONREACTIVE

## 2017-08-12 DIAGNOSIS — Z8249 Family history of ischemic heart disease and other diseases of the circulatory system: Secondary | ICD-10-CM | POA: Diagnosis not present

## 2017-08-12 DIAGNOSIS — Z136 Encounter for screening for cardiovascular disorders: Secondary | ICD-10-CM | POA: Diagnosis not present

## 2017-08-12 DIAGNOSIS — E7849 Other hyperlipidemia: Secondary | ICD-10-CM | POA: Diagnosis not present

## 2017-08-27 DIAGNOSIS — Z136 Encounter for screening for cardiovascular disorders: Secondary | ICD-10-CM | POA: Diagnosis not present

## 2017-10-07 DIAGNOSIS — E7849 Other hyperlipidemia: Secondary | ICD-10-CM | POA: Diagnosis not present

## 2017-10-07 DIAGNOSIS — Z8249 Family history of ischemic heart disease and other diseases of the circulatory system: Secondary | ICD-10-CM | POA: Diagnosis not present

## 2017-10-18 DIAGNOSIS — E7849 Other hyperlipidemia: Secondary | ICD-10-CM | POA: Diagnosis not present

## 2017-10-18 DIAGNOSIS — Z0189 Encounter for other specified special examinations: Secondary | ICD-10-CM | POA: Diagnosis not present

## 2017-10-18 DIAGNOSIS — Z8249 Family history of ischemic heart disease and other diseases of the circulatory system: Secondary | ICD-10-CM | POA: Diagnosis not present

## 2017-10-18 DIAGNOSIS — Z136 Encounter for screening for cardiovascular disorders: Secondary | ICD-10-CM | POA: Diagnosis not present

## 2017-11-22 ENCOUNTER — Telehealth: Payer: Self-pay

## 2017-11-22 DIAGNOSIS — E785 Hyperlipidemia, unspecified: Secondary | ICD-10-CM

## 2017-11-22 NOTE — Telephone Encounter (Signed)
Copied from CRM [id number]. Topic: General - Other >> Nov 22, 2017 11:13 AM Elliot Gault wrote: Relation to pt: Jahir, Halt (wife)  Call back number: [phone]    Reason for call:  Patient was last  seen 05/31/17 by Dr. Fabian Sharp, patient requesting "heart scan" ,  not experiencing any symptoms requesting orders for a peace of mind, please advise

## 2017-11-22 NOTE — Telephone Encounter (Signed)
Pt's wife requesting that the pt have heart scan done as well even if it is not covered by insurance. Pt's wife aware that scan may not be covered by insurance and may require self pay.

## 2017-11-23 NOTE — Telephone Encounter (Signed)
Pt has high cholesterol and is on medication Pt and wife are wanting the heart scan for "peace of mind" and to thorough that his hyperlipidemia is not causing any issues with his heart or other medication conditions.  Aware that we will call back once order is placed if approved.

## 2017-11-23 NOTE — Addendum Note (Signed)
Addended by: Maisie Fus on: 11/23/2017 03:35 PM   Modules accepted: Orders

## 2017-11-23 NOTE — Telephone Encounter (Signed)
Please advise Dr Panosh, thanks.   

## 2017-11-23 NOTE — Telephone Encounter (Signed)
Pt seen by Cardiologist and was prescribed Crestor by Dr Charleston Poot  Per Dr Fabian Sharp needs to discuss with Dr Charleston Poot this scan and any further treatment or testing options.  Pt wife states that she wants him to go ahead and have this test done and then based on the results they will follow up with Cards. Wife states that she is "trying to be proactive" in her husbands health.   Appt with Dr Fabian Sharp scheduled for 12/20/17 at 1:30pm to discuss results from scan on 12/15/17.  Test ordered. Stacey at [facility] aware. Nothing further needed.

## 2017-11-23 NOTE — Telephone Encounter (Addendum)
Ok to do coronary artery calcium score   But that just tells [place] risk categories   He maybe would benefit from medication  But nor a dynamic test like a   Stress test.   Please place order  Dx  elevated cholesterol   And then plan fu visit to discuss   The 10-year ASCVD risk score Denman George DC Montez Hageman., et al., 2013) is: 1.1%   Values used to calculate the score:     Age: 42 years     Sex: Male     Is Non-Hispanic African American: No     Diabetic: No     Tobacco smoker: No     Systolic Blood Pressure: 100 mmHg     Is BP treated: No     HDL Cholesterol: 57.7 mg/dL     Total Cholesterol: 245 mg/dL

## 2017-12-15 ENCOUNTER — Ambulatory Visit (INDEPENDENT_AMBULATORY_CARE_PROVIDER_SITE_OTHER)
Admission: RE | Admit: 2017-12-15 | Discharge: 2017-12-15 | Disposition: A | Discharge status: Home / Self Care | Payer: Self-pay | Source: Ambulatory Visit | Attending: Internal Medicine | Admitting: Internal Medicine

## 2017-12-15 DIAGNOSIS — E785 Hyperlipidemia, unspecified: Secondary | ICD-10-CM

## 2017-12-20 ENCOUNTER — Ambulatory Visit (INDEPENDENT_AMBULATORY_CARE_PROVIDER_SITE_OTHER): Payer: BLUE CROSS/BLUE SHIELD | Admitting: Internal Medicine

## 2017-12-20 ENCOUNTER — Encounter: Payer: Self-pay | Admitting: Internal Medicine

## 2017-12-20 VITALS — BP 110/72 | HR 68 | Temp 98.2°F | Wt 159.8 lb

## 2017-12-20 DIAGNOSIS — R931 Abnormal findings on diagnostic imaging of heart and coronary circulation: Secondary | ICD-10-CM | POA: Diagnosis not present

## 2017-12-20 DIAGNOSIS — E785 Hyperlipidemia, unspecified: Secondary | ICD-10-CM

## 2017-12-20 DIAGNOSIS — Z7189 Other specified counseling: Secondary | ICD-10-CM

## 2017-12-20 DIAGNOSIS — Z8249 Family history of ischemic heart disease and other diseases of the circulatory system: Secondary | ICD-10-CM

## 2017-12-20 NOTE — Progress Notes (Signed)
Chief Complaint  Patient presents with  . Follow-up    Cardiac score test results review    HPI: Jesus Lambert 42 y.o. come in for fu of cac sscore testing of concern    See   Message notes   Sx none   Family :  Father age 159   Took stress test.  Poet and negative    Runs no issue .   Dr Jesus Lambert   recently place d him on Crestor 20 for a few months when lipids were elevated  In June .  Family eats heart healthy  ROS: See pertinent positives and negatives per HPI.  Past Medical History:  Diagnosis Date  . Crohn disease (HCC)    last colonoscopy 2014 dr [facility]  . Crohn's disease (HCC)   . Drug reaction    [facility] in [facility]  . Hx of varicella     Family History  Problem Relation Age of Onset  . Hyperlipidemia Father   . Lactose intolerance Sister        gluten sensitiviey not celiac  . Lactose intolerance Son     Social History   Socioeconomic History  . Marital status: Married    Spouse name: Not on file  . Number of children: Not on file  . Years of education: Not on file  . Highest education level: Not on file  Occupational History  . Occupation: VP  Social Needs  . Financial resource strain: Not on file  . Food insecurity:    Worry: Not on file    Inability: Not on file  . Transportation needs:    Medical: Not on file    Non-medical: Not on file  Tobacco Use  . Smoking status: Never Smoker  . Smokeless tobacco: Former Neurosurgeon    Types: Chew  Substance and Sexual Activity  . Alcohol use: Yes    Alcohol/week: 0.0 standard drinks    Comment: occasional 1-2 weekly  . Drug use: No  . Sexual activity: Not on file  Lifestyle  . Physical activity:    Days per week: Not on file    Minutes per session: Not on file  . Stress: Not on file  Relationships  . Social connections:    Talks on phone: Not on file    Gets together: Not on file    Attends religious service: Not on file    Active member of club or organization: Not on file    Attends  meetings of clubs or organizations: Not on file    Relationship status: Not on file  Other Topics Concern  . 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    Neg td 1-2 etoh per week or less   Reg exercise aerobic and weights     Outpatient Medications Prior to Visit  Medication Sig Dispense Refill  . fish oil-omega-3 fatty acids 1000 MG capsule Take 2 g by mouth daily.    . Loratadine (CLARITIN PO) Take by mouth.    . Multiple Vitamins-Minerals (CENTRUM ADULTS PO) Take by mouth.    . Probiotic Product (PROBIOTIC PO) Take by mouth.    . triamcinolone (NASACORT) 55 MCG/ACT nasal inhaler Place 2  sprays into the nose daily as needed (for allergies).    . rosuvastatin (CRESTOR) 20 MG tablet Take 20 mg by mouth daily.  2   No facility-administered medications prior to visit.      EXAM:  BP 110/72 (BP Location: Right Arm, Patient Position: Sitting, Cuff Size: Normal)   Pulse 68   Temp 98.2 F (36.8 C) (Oral)   Wt 159 lb 12.8 oz (72.5 kg)   BMI 26.19 kg/m   Body mass index is 26.19 kg/m.  GENERAL: vitals reviewed and listed above, alert, oriented, appears well hydrated and in no acute distress PSYCH: pleasant and cooperative, no obvious depression or anxiety Lab Results  Component Value Date   WBC 4.9 05/31/2017   HGB 13.8 05/31/2017   HCT 41.0 05/31/2017   PLT 182.0 05/31/2017   GLUCOSE 76 05/31/2017   CHOL 245 (H) 05/31/2017   TRIG 109.0 05/31/2017   HDL 57.70 05/31/2017   LDLCALC 165 (H) 05/31/2017   ALT 15 05/31/2017   AST 19 05/31/2017   NA 139 05/31/2017   K 4.0 05/31/2017   CL 102 05/31/2017   CREATININE 1.16 05/31/2017   BUN 15 05/31/2017   CO2 29 05/31/2017   TSH 2.40 05/31/2017   BP Readings from Last 3 Encounters:  12/20/17 110/72  05/31/17  100/64  05/27/16 98/60   The 10-year ASCVD risk score (Goff DC Jr., et al., 2013) is: 1.3%   Values used to calculate the score:     Age: 21 years     Sex: Male     Is Non-Hispanic African American: No     Diabetic: No     Tobacco smoker: No     Systolic Blood Pressure: 110 mmHg     Is BP treated: No     HDL Cholesterol: 57.7 mg/dL     Total Cholesterol: 245 mg/dL ADDENDUM REPORT: 16/10/9602 11:59  CLINICAL DATA:  Risk stratification  EXAM: Coronary Calcium Score  TECHNIQUE: The patient was scanned on a Siemens Somatom 64 slice scanner. Axial non-contrast 3 mm slices were carried out through the heart. The data set was analyzed on a dedicated work station and scored using the Agatson method.  FINDINGS: Non-cardiac: See separate report from [facility].  Ascending aorta: Normal diameter 2.9 cm  Pericardium: Normal  Coronary arteries: One isolated punctate area of calcium in distal LM  IMPRESSION: Coronary calcium score of 2. Percentiles only calculated from age 47 and above For this age 45 th percentile .  Jesus [facility]  ASSESSMENT AND PLAN:  Discussed the following assessment and plan:  Hyperlipidemia, unspecified hyperlipidemia type  Family history of heart disease - fat had stent and commplication cabg age 36  Elevated coronary artery calcium score for age  - 46th percnetile for age 22 and above   Cardiac risk counseling Confirming probable  Benefit from statin medication in addition to his   Healthy lifestyle.  Will send  Info to dr Jesus Lambert.  -Patient advised to return or notify health care team  if  new concerns arise.  Patient Instructions  I will send  Note copy to your cardiologist   .  agree with stay on .  The statin therapy and Continue lifestyle intervention healthy eating and exercise .     Jesus Lambert.  M.D.

## 2017-12-20 NOTE — Patient Instructions (Signed)
I will send  Note copy to your cardiologist   .  agree with stay on .  The statin therapy and Continue lifestyle intervention healthy eating and exercise .

## 2018-01-11 DIAGNOSIS — H40013 Open angle with borderline findings, low risk, bilateral: Secondary | ICD-10-CM | POA: Diagnosis not present

## 2018-02-23 ENCOUNTER — Other Ambulatory Visit: Payer: Self-pay | Admitting: Cardiology

## 2018-02-23 DIAGNOSIS — E7849 Other hyperlipidemia: Secondary | ICD-10-CM | POA: Diagnosis not present

## 2018-02-24 LAB — COMPREHENSIVE METABOLIC PANEL
ALBUMIN: 4.6 g/dL (ref 4.0–5.0)
ALT: 54 IU/L — ABNORMAL HIGH (ref 0–44)
AST: 42 IU/L — ABNORMAL HIGH (ref 0–40)
Albumin/Globulin Ratio: 2 (ref 1.2–2.2)
Alkaline Phosphatase: 48 IU/L (ref 39–117)
BILIRUBIN TOTAL: 0.5 mg/dL (ref 0.0–1.2)
BUN/Creatinine Ratio: 10 (ref 9–20)
BUN: 12 mg/dL (ref 6–24)
CO2: 22 mmol/L (ref 20–29)
CREATININE: 1.23 mg/dL (ref 0.76–1.27)
Calcium: 9.2 mg/dL (ref 8.7–10.2)
Chloride: 102 mmol/L (ref 96–106)
GFR calc non Af Amer: 71 mL/min/{1.73_m2} (ref >59)
GFR, EST AFRICAN AMERICAN: 83 mL/min/{1.73_m2} (ref >59)
GLOBULIN, TOTAL: 2.3 g/dL (ref 1.5–4.5)
GLUCOSE: 81 mg/dL (ref 65–99)
Potassium: 4.5 mmol/L (ref 3.5–5.2)
SODIUM: 139 mmol/L (ref 134–144)
TOTAL PROTEIN: 6.9 g/dL (ref 6.0–8.5)

## 2018-02-24 LAB — LIPID PANEL W/O CHOL/HDL RATIO
Cholesterol, Total: 131 mg/dL (ref 100–199)
HDL: 46 mg/dL (ref >39)
LDL CALC: 71 mg/dL (ref 0–99)
TRIGLYCERIDES: 70 mg/dL (ref 0–149)
VLDL CHOLESTEROL CAL: 14 mg/dL (ref 5–40)

## 2018-03-07 DIAGNOSIS — H40013 Open angle with borderline findings, low risk, bilateral: Secondary | ICD-10-CM | POA: Diagnosis not present

## 2018-06-23 DIAGNOSIS — M4722 Other spondylosis with radiculopathy, cervical region: Secondary | ICD-10-CM | POA: Diagnosis not present

## 2018-06-23 DIAGNOSIS — M1711 Unilateral primary osteoarthritis, right knee: Secondary | ICD-10-CM | POA: Diagnosis not present

## 2018-06-23 DIAGNOSIS — M542 Cervicalgia: Secondary | ICD-10-CM | POA: Diagnosis not present

## 2018-06-23 DIAGNOSIS — M7701 Medial epicondylitis, right elbow: Secondary | ICD-10-CM | POA: Diagnosis not present

## 2018-06-23 DIAGNOSIS — M1712 Unilateral primary osteoarthritis, left knee: Secondary | ICD-10-CM | POA: Diagnosis not present

## 2018-10-31 ENCOUNTER — Other Ambulatory Visit: Payer: Self-pay

## 2018-10-31 ENCOUNTER — Encounter: Payer: Self-pay | Admitting: Cardiology

## 2018-10-31 ENCOUNTER — Ambulatory Visit (INDEPENDENT_AMBULATORY_CARE_PROVIDER_SITE_OTHER): Payer: BLUE CROSS/BLUE SHIELD | Admitting: Cardiology

## 2018-10-31 VITALS — BP 114/81 | HR 57 | Ht 67.0 in | Wt 158.3 lb

## 2018-10-31 DIAGNOSIS — E7849 Other hyperlipidemia: Secondary | ICD-10-CM | POA: Diagnosis not present

## 2018-10-31 NOTE — Progress Notes (Signed)
Primary Physician/Referring:  Burnis Medin, MD  Patient ID: Jesus [facility], male    DOB: [birth year], 43 y.o.   MRN: [id number]  Chief Complaint  Patient presents with  . Hyperlipidemia    1 year  . Follow-up   HPI:    Jesus Lambert  is a 43 y.o. [place] male with  Hyperlipidemia. He is presently on Crestor and has responded well. He has a stong family history of hyperlipidemia and his father had an MI at age 93. Medical history includes Crohns disease.  He has no h/o hypertension or diabetes. He eats a healthy vegetarian diet low in fats and exercises 4-5 days a week doing cardio and weight lifting. He is asymptomatic.   He discontinued Crestor sometime in July 2019 after he had tennis elbow right.  Past Medical History:  Diagnosis Date  . Crohn disease ([place])    last colonoscopy 2014 dr Collene Mares  . Crohn's disease ([place])   . Drug reaction    [facility]  . Hx of varicella   . RECTAL BLEEDING 12/17/2006   Qualifier: Diagnosis of  By: Silvio Pate MD, [facility]    Past Surgical History:  Procedure Laterality Date  . APPENDECTOMY  1986  . ARTHROSCOPIC REPAIR ACL  2005   left  sports   . SHOULDER SURGERY  1999   left lifting   Social History   Socioeconomic History  . Marital status: Married    Spouse name: Not on file  . Number of children: 2  . Years of education: Not on file  . Highest education level: Not on file  Occupational History  . Occupation: VP  Social Needs  . Financial resource strain: Not on file  . Food insecurity    Worry: Not on file    Inability: Not on file  . Transportation needs    Medical: Not on file    Non-medical: Not on file  Tobacco Use  . Smoking status: Never Smoker  . Smokeless tobacco: Former Systems developer    Types: Chew  Substance and Sexual Activity  . Alcohol use: Yes    Alcohol/week: 0.0 standard drinks    Comment: occasional 1-2 weekly  . Drug use: No  . Sexual activity: Not on file  Lifestyle  . Physical  activity    Days per week: Not on file    Minutes per session: Not on file  . Stress: Not on file  Relationships  . Social Herbalist on phone: Not on file    Gets together: Not on file    Attends religious service: Not on file    Active member of club or organization: Not on file    Attends meetings of clubs or organizations: Not on file    Relationship status: Not on file  . Intimate partner violence    Fear of current or ex partner: Not on file    Emotionally abused: Not on file    Physically abused: Not on file    Forced sexual activity: Not on file  Other Topics Concern  . 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    Neg td 1-2 etoh per  week or less   Reg exercise aerobic and weights    ROS  Review of Systems  Constitution: Negative for chills, decreased appetite, malaise/fatigue and weight gain.  Cardiovascular: Negative for dyspnea on exertion, leg swelling and syncope.  Endocrine: Negative for cold intolerance.  Hematologic/Lymphatic: Does not bruise/bleed easily.  Musculoskeletal: Negative for joint swelling.  Gastrointestinal: Negative for abdominal pain, anorexia, change in bowel habit, hematochezia and melena.  Neurological: Negative for headaches and light-headedness.  Psychiatric/Behavioral: Negative for depression and substance abuse.  All other systems reviewed and are negative.  Objective   Vitals with BMI 10/31/2018 12/20/2017 05/31/2017  Height '5\' 7"'$  - 5' 5.5"  Weight 158 lbs 5 oz 159 lbs 13 oz 151 lbs 11 oz  BMI 46.96 - 29.52  Systolic 841 324 401  Diastolic 81 72 64  Pulse 57 68 74    Blood pressure 114/81, pulse (!) 57, height '5\' 7"'$  (1.702 m), weight 158 lb 4.8 oz (71.8 kg), SpO2 96 %. Body mass index is 24.79 kg/m.    Physical Exam  HENT:  Head: Atraumatic.  Eyes: Conjunctivae are normal.  Neck: Neck supple. No JVD present. No thyromegaly present.  Cardiovascular: Normal rate, regular rhythm, normal heart sounds and intact distal pulses. Exam reveals no gallop.  No murmur heard. No leg edema, no JVD.  Pulmonary/Chest: Effort normal and breath sounds normal.  Abdominal: Soft. Bowel sounds are normal.  Musculoskeletal: Normal range of motion.  Neurological: He is alert.  Skin: Skin is warm and dry.  Psychiatric: He has a normal mood and affect.   Radiology: No results found.  Laboratory examination:   Recent Labs    02/23/18 0953  NA 139  K 4.5  CL 102  CO2 22  GLUCOSE 81  BUN 12  CREATININE 1.23  CALCIUM 9.2  GFRNONAA 71  GFRAA 83   CMP Latest Ref Rng & Units 02/23/2018 05/31/2017 05/27/2016  Glucose 65 - 99 mg/dL 81 76 71  BUN 6 - 24 mg/dL '12 15 15  '$ Creatinine 0.76 - 1.27 mg/dL 1.23 1.16 1.19  Sodium 134 - 144 mmol/L 139 139 139  Potassium 3.5 - 5.2 mmol/L 4.5 4.0 4.0  Chloride 96 - 106 mmol/L 102 102 104  CO2 20 - 29 mmol/L '22 29 28  '$ Calcium 8.7 - 10.2 mg/dL 9.2 9.2 9.4  Total Protein 6.0 - 8.5 g/dL 6.9 7.2 7.3  Total Bilirubin 0.0 - 1.2 mg/dL 0.5 0.9 0.7  Alkaline Phos 39 - 117 IU/L 48 41 38(L)  AST 0 - 40 IU/L 42(H) 19 16  ALT 0 - 44 IU/L 54(H) 15 12   CBC Latest Ref Rng & Units 05/31/2017 05/27/2016 10/24/2014  WBC 4.0 - 10.5 K/uL 4.9 5.6 6.6  Hemoglobin 13.0 - 17.0 g/dL 13.8 13.8 13.9  Hematocrit 39.0 - 52.0 % 41.0 41.4 42.9  Platelets 150.0 - 400.0 K/uL 182.0 193.0 187.0   Lipid Panel  Labs 05/31/2017: Cholesterol 245, triglycerides 109, HDL 57, LDL 165. TSH 2.4. Hepatic function panel normal. CBC normal. Potassium 4.0, creatinine 1.16, EGFR 73, BMP normal.      Component Value Date/Time   CHOL 131 02/23/2018 0953   TRIG 70 02/23/2018 0953   HDL 46 02/23/2018 0953   CHOLHDL 4 05/31/2017 1035   VLDL 21.8 05/31/2017 1035   LDLCALC 71 02/23/2018 0953   HEMOGLOBIN  A1C No results found for: HGBA1C, MPG TSH No results for input(s): TSH in the last 8760 hours. Medications and allergies   Allergies  Allergen  Reactions  . Lactose Intolerance (Gi)   . [facility] [Mesalamine]     Hospitalized     Prior to Admission medications   Medication Sig Start Date End Date Taking? Authorizing Provider  fish oil-omega-3 fatty acids 1000 MG capsule Take 2 g by mouth daily.    [provider]  Loratadine (CLARITIN PO) Take by mouth.    [provider]  Multiple Vitamins-Minerals (CENTRUM ADULTS PO) Take by mouth.    [provider]  Probiotic Product (PROBIOTIC PO) Take by mouth.    [provider]  rosuvastatin (CRESTOR) 20 MG tablet Take 20 mg by mouth daily. 11/29/17   [provider]  triamcinolone (NASACORT) 55 MCG/ACT nasal inhaler Place 2 sprays into the nose daily as needed (for allergies).    [provider]     Current Outpatient Medications  Medication Instructions  . fish oil-omega-3 fatty acids 2 g, Daily  . Loratadine (CLARITIN PO) Oral  . Multiple Vitamins-Minerals (CENTRUM ADULTS PO) Oral  . Probiotic Product (PROBIOTIC PO) Oral  . rosuvastatin (CRESTOR) 20 mg, Oral, Daily, On hold for now for side effects  . triamcinolone (NASACORT) 55 MCG/ACT nasal inhaler 2 sprays, Daily PRN    Cardiac Studies:   Treadmill stress test  08/27/2017: Indication: Screening for CAD The patient exercised on Bruce protocol for 10:53mn. Patient achieved 11.94METS and reached HR 176 bpm, which is 98 % of maximum age-predicted HR. Stress test terminated due to fatigue.  Exercise capacity was normal. HR Response to Exercise: Appropriate. BP Response to Exercise: Normal resting BP- appropriate response. Chest Pain: none. Arrhythmias: none. Resting EKG demonstrates Normal sinus rhythm. ST Changes: With peak exercise there was no ST-T changes of ischemia.  Overall Impression: Normal stress test. Continue  primary/secondary prevention.  Assessment     ICD-10-CM   1. Familial hyperlipidemia, high LDL  E78.49 EKG 12-Lead    EKG 10/31/2018: Sinus bradycardia at the rate of 51 bpm, otherwise normal EKG.  Recommendations:   Patient is here on a annual visit and follow-up of hyperlipidemia, due to right tennis elbow, he discontinued taking statins about 3 months ago.  Symptoms improved, he also had a steroid shot.  As the symptoms are ongoing in spite of discontinuing Crestor for many months, I do not think statins have anything to do with his presentation, advised him to restart statins, his LDL is to be around 175-176 which is improved to 71 in February 2019.  His LFTs were mildly abnormal as well.  In view of this, LDL goal of less than 100 should be fine as he does not have any other cardiovascular risk factors except for Familial heterozygous hyperlipidemia and his dad has had coronary disease.  He will restart Crestor at 5 mg once a day and patient, to follow-up with his PCP and will request labs to be done sometime in February on his annual physical.  I will see him back on a p.r.n. basis.  JAdrian Prows MD, FCleveland Clinic [facility]on Hospital10/19/2020, 8:51 AM PBucknerCardiovascular. PParkPager: [phone] Office: [phone] no answer Cell [phone]

## 2018-11-30 ENCOUNTER — Telehealth: Payer: Self-pay

## 2018-11-30 ENCOUNTER — Telehealth (INDEPENDENT_AMBULATORY_CARE_PROVIDER_SITE_OTHER): Payer: BC Managed Care – PPO | Admitting: Internal Medicine

## 2018-11-30 ENCOUNTER — Encounter: Payer: Self-pay | Admitting: Internal Medicine

## 2018-11-30 ENCOUNTER — Other Ambulatory Visit: Payer: Self-pay

## 2018-11-30 DIAGNOSIS — E785 Hyperlipidemia, unspecified: Secondary | ICD-10-CM | POA: Diagnosis not present

## 2018-11-30 DIAGNOSIS — R229 Localized swelling, mass and lump, unspecified: Secondary | ICD-10-CM | POA: Diagnosis not present

## 2018-11-30 DIAGNOSIS — Z0000 Encounter for general adult medical examination without abnormal findings: Secondary | ICD-10-CM

## 2018-11-30 DIAGNOSIS — Z79899 Other long term (current) drug therapy: Secondary | ICD-10-CM | POA: Diagnosis not present

## 2018-11-30 DIAGNOSIS — K5090 Crohn's disease, unspecified, without complications: Secondary | ICD-10-CM | POA: Diagnosis not present

## 2018-11-30 NOTE — Telephone Encounter (Signed)
Copied from [place] [id number]. Topic: General - Other >> Nov 30, 2018 10:52 AM Rainey Pines A wrote: Patient has requested to complete his lab work on a different date than his scheduled cpe.

## 2018-11-30 NOTE — Telephone Encounter (Signed)
pt has lab appt

## 2018-11-30 NOTE — Progress Notes (Signed)
Virtual Visit via Video Note  I connected with@ on 11/30/18 at 10:30 AM EST by a video enabled telemedicine application and verified that I am speaking with the correct person using two identifiers. Location patient: home Location provider:workoffice Persons participating in the virtual visit: patient, provider  WIth national recommendations  regarding COVID 19 pandemic   video visit is advised over in office visit for this patient.  Patient aware  of the limitations of evaluation and management by telemedicine and  availability of in person appointments. and agreed to proceed.   HPI: [facility] presents for video visit Because he has a lump in his right anterior lateral chest wall that he noticed when he was stretching yesterday. He states that he may have had it a year ago and then thought it went away and he noticed it yesterday it is soft thinks it is mobile is nontender no skin changes and no other lumps. His bowels advise he just get it checked out. Otherwise he is doing well Crohn's is quiescent is on lipid medicine Crestor and due for lab test in the near future. Thought that it might of caused an elbow pain tendinitis but was not related in his back on the Crestor.   ROS: See pertinent positives and negatives per HPI.  No fever or systemic symptoms.  Has some male pattern baldness.  Receding hairline.  Past Medical History:  Diagnosis Date  . Crohn disease ([place])    last colonoscopy 2014 dr Collene Mares  . Crohn's disease ([place])   . Drug reaction    [facility]  . Hx of varicella   . RECTAL BLEEDING 12/17/2006   Qualifier: Diagnosis of  By: Silvio Pate MD, [facility]     Past Surgical History:  Procedure Laterality Date  . APPENDECTOMY  1986  . ARTHROSCOPIC REPAIR ACL  2005   left  sports   . SHOULDER SURGERY  1999   left lifting    Family History  Problem Relation Age of Onset  . Hyperlipidemia Father   . Heart disease Father   . Lactose intolerance  Sister        gluten sensitiviey not celiac  . Hyperlipidemia Sister   . Lactose intolerance Son   . Hyperlipidemia Mother     Social History   Tobacco Use  . Smoking status: Never Smoker  . Smokeless tobacco: Former Systems developer    Types: Chew  Substance Use Topics  . Alcohol use: Yes    Alcohol/week: 0.0 standard drinks    Comment: occasional 1-2 weekly  . Drug use: No      Current Outpatient Medications:  .  fish oil-omega-3 fatty acids 1000 MG capsule, Take 2 g by mouth daily., Disp: , Rfl:  .  Loratadine (CLARITIN PO), Take by mouth., Disp: , Rfl:  .  Multiple Vitamins-Minerals (CENTRUM ADULTS PO), Take by mouth., Disp: , Rfl:  .  Probiotic Product (PROBIOTIC PO), Take by mouth., Disp: , Rfl:  .  rosuvastatin (CRESTOR) 20 MG tablet, Take 20 mg by mouth daily. On hold for now for side effects, Disp: , Rfl: 2 .  triamcinolone (NASACORT) 55 MCG/ACT nasal inhaler, Place 2 sprays into the nose daily as needed (for allergies)., Disp: , Rfl:   EXAM: BP Readings from Last 3 Encounters:  10/31/18 114/81  12/20/17 110/72  05/31/17 100/64    VITALS per patient if applicable: GENERAL: alert, oriented, appears well and in no acute distress appears well.  HEENT: atraumatic, conjunttiva clear,  no obvious abnormalities on inspection of external nose and ears  NECK: normal movements of the head and neck Chest wall shows normal rib findings subtle small subcutaneous lump that he can feel and is mobile over the lateral clavicular line.  This is about T7 or 6.  There is no pain nothing else on inspection.  LUNGS: on inspection no signs of respiratory distress, breathing rate appears normal, no obvious gross SOB, gasping or wheezing  CV: no obvious cyanosis  MS: moves all visible extremities without noticeable abnormality  PSYCH/NEURO: pleasant and cooperative, no obvious depression or anxiety, speech and thought processing grossly intact Lab Results  Component Value Date   WBC 4.9  05/31/2017   HGB 13.8 05/31/2017   HCT 41.0 05/31/2017   PLT 182.0 05/31/2017   GLUCOSE 81 02/23/2018   CHOL 131 02/23/2018   TRIG 70 02/23/2018   HDL 46 02/23/2018   LDLCALC 71 02/23/2018   ALT 54 (H) 02/23/2018   AST 42 (H) 02/23/2018   NA 139 02/23/2018   K 4.5 02/23/2018   CL 102 02/23/2018   CREATININE 1.23 02/23/2018   BUN 12 02/23/2018   CO2 22 02/23/2018   TSH 2.40 05/31/2017    ASSESSMENT AND PLAN:  Discussed the following assessment and plan:    ICD-10-CM   1. Lump of skin thorax  R22.9 Basic metabolic panel    CBC with Differential    Hepatic function panel    Lipid panel    TSH   appears to be [place]  not attached to rib cage and no axillary nodes no skin changes poss lipoma or cyst  about almond size sounds benign and follow at his cpx due  2. lab for  for preventive health examination  Z00.00 Basic metabolic panel    CBC with Differential    Hepatic function panel    Lipid panel    TSH  3. Hyperlipidemia, unspecified hyperlipidemia type  E78.5 Basic metabolic panel    CBC with Differential    Hepatic function panel    Lipid panel    TSH  4. Medication management  Z79.899 Basic metabolic panel    CBC with Differential    Hepatic function panel    Lipid panel    TSH  5. Crohn's disease without complication, unspecified gastrointestinal tract location (HCC)  K50.90 Basic metabolic panel    CBC with Differential    Hepatic function panel    Lipid panel    TSH   Appears to be a benign process would like to feel it however since it has been there a year and comes and goes assured that is not lymphadenopathy is well otherwise Crohn's is quiescent. He is on statin medicine and due for follow-up sees Dr. Jacinto Halim Plan CPX labs to include liver and lipid and then CPX in the next few months. We will examine the area when he comes in. Counseled.   Expectant management and discussion of plan and treatment with opportunity to ask questions and all were answered. The  patient agreed with the plan and demonstrated an understanding of the instructions.   Advised to call back or seek an in-person evaluation if worsening  or having  further concerns . Return for fasting lab and cpx .    Berniece Andreas, MD

## 2019-01-02 DIAGNOSIS — Z03818 Encounter for observation for suspected exposure to other biological agents ruled out: Secondary | ICD-10-CM | POA: Diagnosis not present

## 2019-01-23 ENCOUNTER — Other Ambulatory Visit: Payer: Self-pay

## 2019-01-23 ENCOUNTER — Other Ambulatory Visit (INDEPENDENT_AMBULATORY_CARE_PROVIDER_SITE_OTHER): Payer: BC Managed Care – PPO

## 2019-01-23 DIAGNOSIS — R229 Localized swelling, mass and lump, unspecified: Secondary | ICD-10-CM

## 2019-01-23 DIAGNOSIS — Z0000 Encounter for general adult medical examination without abnormal findings: Secondary | ICD-10-CM

## 2019-01-23 DIAGNOSIS — K5090 Crohn's disease, unspecified, without complications: Secondary | ICD-10-CM | POA: Diagnosis not present

## 2019-01-23 DIAGNOSIS — Z79899 Other long term (current) drug therapy: Secondary | ICD-10-CM | POA: Diagnosis not present

## 2019-01-23 DIAGNOSIS — E785 Hyperlipidemia, unspecified: Secondary | ICD-10-CM | POA: Diagnosis not present

## 2019-01-23 LAB — CBC WITH DIFFERENTIAL/PLATELET
Basophils Absolute: 0 10*3/uL (ref 0.0–0.1)
Basophils Relative: 0.5 % (ref 0.0–3.0)
Eosinophils Absolute: 0.2 10*3/uL (ref 0.0–0.7)
Eosinophils Relative: 3.1 % (ref 0.0–5.0)
HCT: 39.8 % (ref 39.0–52.0)
Hemoglobin: 13.2 g/dL (ref 13.0–17.0)
Lymphocytes Relative: 35.6 % (ref 12.0–46.0)
Lymphs Abs: 1.8 10*3/uL (ref 0.7–4.0)
MCHC: 33.1 g/dL (ref 30.0–36.0)
MCV: 89.1 fl (ref 78.0–100.0)
Monocytes Absolute: 0.5 10*3/uL (ref 0.1–1.0)
Monocytes Relative: 9.6 % (ref 3.0–12.0)
Neutro Abs: 2.5 10*3/uL (ref 1.4–7.7)
Neutrophils Relative %: 51.2 % (ref 43.0–77.0)
Platelets: 209 10*3/uL (ref 150.0–400.0)
RBC: 4.47 Mil/uL (ref 4.22–5.81)
RDW: 13.3 % (ref 11.5–15.5)
WBC: 4.9 10*3/uL (ref 4.0–10.5)

## 2019-01-23 LAB — HEPATIC FUNCTION PANEL
ALT: 27 U/L (ref 0–53)
AST: 23 U/L (ref 0–37)
Albumin: 4.4 g/dL (ref 3.5–5.2)
Alkaline Phosphatase: 43 U/L (ref 39–117)
Bilirubin, Direct: 0.1 mg/dL (ref 0.0–0.3)
Total Bilirubin: 0.6 mg/dL (ref 0.2–1.2)
Total Protein: 7.1 g/dL (ref 6.0–8.3)

## 2019-01-23 LAB — LIPID PANEL
Cholesterol: 140 mg/dL (ref 0–200)
HDL: 43.9 mg/dL (ref >39.00)
LDL Cholesterol: 81 mg/dL (ref 0–99)
NonHDL: 96.19
Total CHOL/HDL Ratio: 3
Triglycerides: 74 mg/dL (ref 0.0–149.0)
VLDL: 14.8 mg/dL (ref 0.0–40.0)

## 2019-01-23 LAB — TSH: TSH: 3.51 u[IU]/mL (ref 0.35–4.50)

## 2019-01-23 LAB — BASIC METABOLIC PANEL
BUN: 14 mg/dL (ref 6–23)
CO2: 26 mEq/L (ref 19–32)
Calcium: 9.2 mg/dL (ref 8.4–10.5)
Chloride: 104 mEq/L (ref 96–112)
Creatinine, Ser: 1.1 mg/dL (ref 0.40–1.50)
GFR: 72.7 mL/min (ref >60.00)
Glucose, Bld: 94 mg/dL (ref 70–99)
Potassium: 3.8 mEq/L (ref 3.5–5.1)
Sodium: 138 mEq/L (ref 135–145)

## 2019-01-31 ENCOUNTER — Other Ambulatory Visit: Payer: Self-pay

## 2019-01-31 ENCOUNTER — Encounter: Payer: Self-pay | Admitting: Internal Medicine

## 2019-01-31 ENCOUNTER — Ambulatory Visit (INDEPENDENT_AMBULATORY_CARE_PROVIDER_SITE_OTHER): Payer: BC Managed Care – PPO | Admitting: Internal Medicine

## 2019-01-31 ENCOUNTER — Other Ambulatory Visit: Payer: Self-pay | Admitting: Internal Medicine

## 2019-01-31 VITALS — BP 112/72 | HR 74 | Temp 97.6°F | Ht 67.0 in | Wt 161.0 lb

## 2019-01-31 DIAGNOSIS — R222 Localized swelling, mass and lump, trunk: Secondary | ICD-10-CM | POA: Diagnosis not present

## 2019-01-31 DIAGNOSIS — Z8719 Personal history of other diseases of the digestive system: Secondary | ICD-10-CM

## 2019-01-31 DIAGNOSIS — Z0000 Encounter for general adult medical examination without abnormal findings: Secondary | ICD-10-CM

## 2019-01-31 DIAGNOSIS — E785 Hyperlipidemia, unspecified: Secondary | ICD-10-CM

## 2019-01-31 NOTE — Patient Instructions (Addendum)
I think the lump area is a small lipoma  A benign lump.  Can recheck if  Enlarging or causing a problem .  Continue lifestyle intervention healthy eating and exercise .  Make sure you get your colonoscopy when due   Lab Results  Component Value Date   WBC 4.9 01/23/2019   HGB 13.2 01/23/2019   HCT 39.8 01/23/2019   PLT 209.0 01/23/2019   GLUCOSE 94 01/23/2019   CHOL 140 01/23/2019   TRIG 74.0 01/23/2019   HDL 43.90 01/23/2019   LDLCALC 81 01/23/2019   ALT 27 01/23/2019   AST 23 01/23/2019   NA 138 01/23/2019   K 3.8 01/23/2019   CL 104 01/23/2019   CREATININE 1.10 01/23/2019   BUN 14 01/23/2019   CO2 26 01/23/2019   TSH 3.51 01/23/2019    Preventive Care 45-45 Years Old, Male Preventive care refers to lifestyle choices and visits with your health care provider that can promote health and wellness. This includes:  A yearly physical exam. This is also called an annual well check.  Regular dental and eye exams.  Immunizations.  Screening for certain conditions.  Healthy lifestyle choices, such as eating a healthy diet, getting regular exercise, not using drugs or products that contain nicotine and tobacco, and limiting alcohol use. What can I expect for my preventive care visit? Physical exam Your health care provider will check:  Height and weight. These may be used to calculate body mass index (BMI), which is a measurement that tells if you are at a healthy weight.  Heart rate and blood pressure.  Your skin for abnormal spots. Counseling Your health care provider may ask you questions about:  Alcohol, tobacco, and drug use.  Emotional well-being.  Home and relationship well-being.  Sexual activity.  Eating habits.  Work and work Statistician. What immunizations do I need?  Influenza (flu) vaccine  This is recommended every year. Tetanus, diphtheria, and pertussis (Tdap) vaccine  You may need a Td booster every 10 years. Varicella (chickenpox)  vaccine  You may need this vaccine if you have not already been vaccinated. Zoster (shingles) vaccine  You may need this after age 45. Measles, mumps, and rubella (MMR) vaccine  You may need at least one dose of MMR if you were born in 1957 or later. You may also need a second dose. Pneumococcal conjugate (PCV13) vaccine  You may need this if you have certain conditions and were not previously vaccinated. Pneumococcal polysaccharide (PPSV23) vaccine  You may need one or two doses if you smoke cigarettes or if you have certain conditions. Meningococcal conjugate (MenACWY) vaccine  You may need this if you have certain conditions. Hepatitis A vaccine  You may need this if you have certain conditions or if you travel or work in places where you may be exposed to hepatitis A. Hepatitis B vaccine  You may need this if you have certain conditions or if you travel or work in places where you may be exposed to hepatitis B. Haemophilus influenzae type b (Hib) vaccine  You may need this if you have certain risk factors. Human papillomavirus (HPV) vaccine  If recommended by your health care provider, you may need three doses over 6 months. You may receive vaccines as individual doses or as more than one vaccine together in one shot (combination vaccines). Talk with your health care provider about the risks and benefits of combination vaccines. What tests do I need? Blood tests  Lipid and cholesterol levels. These  may be checked every 5 years, or more frequently if you are over 45 years old.  Hepatitis C test.  Hepatitis B test. Screening  Lung cancer screening. You may have this screening every year starting at age 45 if you have a 30-pack-year history of smoking and currently smoke or have quit within the past 15 years.  Prostate cancer screening. Recommendations will vary depending on your family history and other risks.  Colorectal cancer screening. All adults should have this  screening starting at age 45 and continuing until age 81. Your health care provider may recommend screening at age 45 if you are at increased risk. You will have tests every 1-10 years, depending on your results and the type of screening test.  Diabetes screening. This is done by checking your blood sugar (glucose) after you have not eaten for a while (fasting). You may have this done every 1-3 years.  Sexually transmitted disease (STD) testing. Follow these instructions at home: Eating and drinking  Eat a diet that includes fresh fruits and vegetables, whole grains, lean protein, and low-fat dairy products.  Take vitamin and mineral supplements as recommended by your health care provider.  Do not drink alcohol if your health care provider tells you not to drink.  If you drink alcohol: ? Limit how much you have to 0-2 drinks a day. ? Be aware of how much alcohol is in your drink. In the [place] one drink equals one 12 oz bottle of beer (355 mL), one 5 oz glass of wine (148 mL), or one 1 oz glass of hard liquor (44 mL). Lifestyle  Take daily care of your teeth and gums.  Stay active. Exercise for at least 30 minutes on 5 or more days each week.  Do not use any products that contain nicotine or tobacco, such as cigarettes, e-cigarettes, and chewing tobacco. If you need help quitting, ask your health care provider.  If you are sexually active, practice safe sex. Use a condom or other form of protection to prevent STIs (sexually transmitted infections).  Talk with your health care provider about taking a low-dose aspirin every day starting at age 45. What's next?  Go to your health care provider once a year for a well check visit.  Ask your health care provider how often you should have your eyes and teeth checked.  Stay up to date on all vaccines. This information is not intended to replace advice given to you by your health care provider. Make sure you discuss any questions you have  with your health care provider. Document Revised: 12/23/2017 Document Reviewed: 12/23/2017 Elsevier Patient Education  2020 [employer].

## 2019-01-31 NOTE — Progress Notes (Signed)
This visit occurred during the SARS-CoV-2 public health emergency.  Safety protocols were in place, including screening questions prior to the visit, additional usage of staff PPE, and extensive cleaning of exam room while observing appropriate contact time as indicated for disinfecting solutions.    Chief Complaint  Patient presents with  . Annual Exam    Pt wants lumo on chest looked at     HPI: Patient  Jesus Lambert  44 y.o. comes in today for Preventive Health Care visit    Gi no issues  With IBD  5 year plan   Dr Collene Mares Lactose avoidance and vegan type diet  Cards  Once a year for lipids  crestor   10 0nce a day .( 1/2 20)  Lump for a while right chest no sx  Wax and waning no pain sx  Please check  Health Maintenance  Topic Date Due  . TETANUS/TDAP  05/28/2026  . INFLUENZA VACCINE  Completed  . HIV Screening  Completed   Health Maintenance Review LIFESTYLE:  Exercise:   5  D per week 2 day s cardio 30 min run and 3 day hit  Tobacco/ETS:  no Alcohol:   2-3 per week  Sugar beverages: Sleep: about 7  Drug use: no HH of  4  No pets  Work: 50 per week .     ROS: getting eye check  , joints crack  No pain .  GEN/ HEENT: No fever, significant weight changes sweats headaches vision problems hearing changes, CV/ PULM; No chest pain shortness of breath cough, syncope,edema  change in exercise tolerance. GI /GU: No adominal pain, vomiting, change in bowel habits. No blood in the stool. No significant GU symptoms. SKIN/HEME: ,no acute skin rashes suspicious lesions or bleeding. No lymphadenopathy,  NEURO/ PSYCH:  No neurologic signs such as weakness numbness. No depression anxiety. IMM/ Allergy: No unusual infections.  Allergy .   REST of 12 system review negative except as per HPI   Past Medical History:  Diagnosis Date  . Crohn disease ([place])    last colonoscopy 2014 dr Collene Mares  . Crohn's disease ([place])   . Drug reaction    [facility]  . Hx of varicella     . RECTAL BLEEDING 12/17/2006   Qualifier: Diagnosis of  By: Silvio Pate MD, [facility]   . THROMBOCYTOPENIA NOS 08/30/2006   Qualifier: Diagnosis of  By: Diona Browner MD, Amy      Past Surgical History:  Procedure Laterality Date  . APPENDECTOMY  1986  . ARTHROSCOPIC REPAIR ACL  2005   left  sports   . SHOULDER SURGERY  1999   left lifting    Family History  Problem Relation Age of Onset  . Hyperlipidemia Father   . Heart disease Father   . Lactose intolerance Sister        gluten sensitiviey not celiac  . Hyperlipidemia Sister   . Lactose intolerance Son   . Hyperlipidemia Mother       Outpatient Medications Prior to Visit  Medication Sig Dispense Refill  . fish oil-omega-3 fatty acids 1000 MG capsule Take 2 g by mouth daily.    . Loratadine (CLARITIN PO) Take by mouth.    . Multiple Vitamins-Minerals (CENTRUM ADULTS PO) Take by mouth.    . Probiotic Product (PROBIOTIC PO) Take by mouth.    . rosuvastatin (CRESTOR) 20 MG tablet Take 20 mg by mouth daily. On hold for now for side effects  2  .  triamcinolone (NASACORT) 55 MCG/ACT nasal inhaler Place 2 sprays into the nose daily as needed (for allergies).     No facility-administered medications prior to visit.     EXAM:  BP 112/72 (BP Location: Right Arm, Patient Position: Sitting, Cuff Size: Normal)   Pulse 74   Temp 97.6 F (36.4 C) (Temporal)   Ht '5\' 7"'$  (1.702 m)   Wt 161 lb (73 kg)   SpO2 97%   BMI 25.22 kg/m   Body mass index is 25.22 kg/m. Wt Readings from Last 3 Encounters:  01/31/19 161 lb (73 kg)  10/31/18 158 lb 4.8 oz (71.8 kg)  12/20/17 159 lb 12.8 oz (72.5 kg)    Physical Exam: Vital signs reviewed PPI:RJJO is a well-developed well-nourished alert cooperative    who appearsr stated age in no acute distress.  HEENT: normocephalic atraumatic , Eyes: PERRL EOM's full, conjunctiva clear, Nares: tenderness., Ears: no deformity EAC's clear TMs with normal landmarks. Mouth: clear OP,masked NECK: supple  without masses, thyromegaly or bruits. CHEST/PULM:  Clear to auscultation and percussion breath sounds equal no wheeze , rales or rhonchi. No chest wall deformities or tenderness. 2 cm oivoid  Mobile  Lump soft right  Lateral antero chest around t 8  ( feels like lipoma) no skin changes  Breast no nodules and axilla is clar. CV: PMI is nondisplaced, S1 S2 no gallops, murmurs, rubs. Peripheral pulses are full without delay.No JVD .  ABDOMEN: Bowel sounds normal nontender  No guard or rebound, no hepato splenomegal no CVA tenderness.  Extremtities:  No clubbing cyanosis or edema, no acute joint swelling or redness no focal atrophy NEURO:  Oriented x3, cranial nerves 3-12 appear to be intact, no obvious focal weakness,gait within normal limits no abnormal reflexes or asymmetrical SKIN: No acute rashes normal turgor, color, no bruising or petechiae. PSYCH: Oriented, good eye contact, no obvious depression anxiety, cognition and judgment appear normal. LN: no cervical axillary inguinal adenopathy  Lab Results  Component Value Date   WBC 4.9 01/23/2019   HGB 13.2 01/23/2019   HCT 39.8 01/23/2019   PLT 209.0 01/23/2019   GLUCOSE 94 01/23/2019   CHOL 140 01/23/2019   TRIG 74.0 01/23/2019   HDL 43.90 01/23/2019   LDLCALC 81 01/23/2019   ALT 27 01/23/2019   AST 23 01/23/2019   NA 138 01/23/2019   K 3.8 01/23/2019   CL 104 01/23/2019   CREATININE 1.10 01/23/2019   BUN 14 01/23/2019   CO2 26 01/23/2019   TSH 3.51 01/23/2019    BP Readings from Last 3 Encounters:  01/31/19 112/72  10/31/18 114/81  12/20/17 110/72    Lab results reviewed with patient   ASSESSMENT AND PLAN:  Discussed the following assessment and plan:    ICD-10-CM   1. Visit for preventive health examination  Z00.00   2. Lump in chest  R22.2    prob limpoma plan to observe  3. Hyperlipidemia, unspecified hyperlipidemia type  E78.5    on 10 crestor per day   4. History of Crohn's disease  Z87.19    quiescent     Patient Care Team: Payton Prinsen, Standley Brooking, MD as PCP - General (Internal Medicine) Juanita Craver, MD as Consulting Physician (Gastroenterology) Barbaraann Cao, [place] as Referring Physician (Optometry) Adrian Prows, MD as Consulting Physician (Cardiology) Patient Instructions   I think the lump area is a small lipoma  A benign lump.  Can recheck if  Enlarging or causing a problem .  Continue lifestyle intervention  healthy eating and exercise .  Make sure you get your colonoscopy when due   Lab Results  Component Value Date   WBC 4.9 01/23/2019   HGB 13.2 01/23/2019   HCT 39.8 01/23/2019   PLT 209.0 01/23/2019   GLUCOSE 94 01/23/2019   CHOL 140 01/23/2019   TRIG 74.0 01/23/2019   HDL 43.90 01/23/2019   LDLCALC 81 01/23/2019   ALT 27 01/23/2019   AST 23 01/23/2019   NA 138 01/23/2019   K 3.8 01/23/2019   CL 104 01/23/2019   CREATININE 1.10 01/23/2019   BUN 14 01/23/2019   CO2 26 01/23/2019   TSH 3.51 01/23/2019    Preventive Care 91-38 Years Old, Male Preventive care refers to lifestyle choices and visits with your health care provider that can promote health and wellness. This includes:  A yearly physical exam. This is also called an annual well check.  Regular dental and eye exams.  Immunizations.  Screening for certain conditions.  Healthy lifestyle choices, such as eating a healthy diet, getting regular exercise, not using drugs or products that contain nicotine and tobacco, and limiting alcohol use. What can I expect for my preventive care visit? Physical exam Your health care provider will check:  Height and weight. These may be used to calculate body mass index (BMI), which is a measurement that tells if you are at a healthy weight.  Heart rate and blood pressure.  Your skin for abnormal spots. Counseling Your health care provider may ask you questions about:  Alcohol, tobacco, and drug use.  Emotional well-being.  Home and relationship  well-being.  Sexual activity.  Eating habits.  Work and work Statistician. What immunizations do I need?  Influenza (flu) vaccine  This is recommended every year. Tetanus, diphtheria, and pertussis (Tdap) vaccine  You may need a Td booster every 10 years. Varicella (chickenpox) vaccine  You may need this vaccine if you have not already been vaccinated. Zoster (shingles) vaccine  You may need this after age 86. Measles, mumps, and rubella (MMR) vaccine  You may need at least one dose of MMR if you were born in 1957 or later. You may also need a second dose. Pneumococcal conjugate (PCV13) vaccine  You may need this if you have certain conditions and were not previously vaccinated. Pneumococcal polysaccharide (PPSV23) vaccine  You may need one or two doses if you smoke cigarettes or if you have certain conditions. Meningococcal conjugate (MenACWY) vaccine  You may need this if you have certain conditions. Hepatitis A vaccine  You may need this if you have certain conditions or if you travel or work in places where you may be exposed to hepatitis A. Hepatitis B vaccine  You may need this if you have certain conditions or if you travel or work in places where you may be exposed to hepatitis B. Haemophilus influenzae type b (Hib) vaccine  You may need this if you have certain risk factors. Human papillomavirus (HPV) vaccine  If recommended by your health care provider, you may need three doses over 6 months. You may receive vaccines as individual doses or as more than one vaccine together in one shot (combination vaccines). Talk with your health care provider about the risks and benefits of combination vaccines. What tests do I need? Blood tests  Lipid and cholesterol levels. These may be checked every 5 years, or more frequently if you are over 76 years old.  Hepatitis C test.  Hepatitis B test. Screening  Lung cancer  screening. You may have this screening every year  starting at age 42 if you have a 30-pack-year history of smoking and currently smoke or have quit within the past 15 years.  Prostate cancer screening. Recommendations will vary depending on your family history and other risks.  Colorectal cancer screening. All adults should have this screening starting at age 4 and continuing until age 12. Your health care provider may recommend screening at age 19 if you are at increased risk. You will have tests every 1-10 years, depending on your results and the type of screening test.  Diabetes screening. This is done by checking your blood sugar (glucose) after you have not eaten for a while (fasting). You may have this done every 1-3 years.  Sexually transmitted disease (STD) testing. Follow these instructions at home: Eating and drinking  Eat a diet that includes fresh fruits and vegetables, whole grains, lean protein, and low-fat dairy products.  Take vitamin and mineral supplements as recommended by your health care provider.  Do not drink alcohol if your health care provider tells you not to drink.  If you drink alcohol: ? Limit how much you have to 0-2 drinks a day. ? Be aware of how much alcohol is in your drink. In the [place] one drink equals one 12 oz bottle of beer (355 mL), one 5 oz glass of wine (148 mL), or one 1 oz glass of hard liquor (44 mL). Lifestyle  Take daily care of your teeth and gums.  Stay active. Exercise for at least 30 minutes on 5 or more days each week.  Do not use any products that contain nicotine or tobacco, such as cigarettes, e-cigarettes, and chewing tobacco. If you need help quitting, ask your health care provider.  If you are sexually active, practice safe sex. Use a condom or other form of protection to prevent STIs (sexually transmitted infections).  Talk with your health care provider about taking a low-dose aspirin every day starting at age 46. What's next?  Go to your health care provider once a year  for a well check visit.  Ask your health care provider how often you should have your eyes and teeth checked.  Stay up to date on all vaccines. This information is not intended to replace advice given to you by your health care provider. Make sure you discuss any questions you have with your health care provider. Document Revised: 12/23/2017 Document Reviewed: 12/23/2017 Elsevier Patient Education  2020 [place] Phylis Javed M.D.

## 2019-03-29 DIAGNOSIS — H53143 Visual discomfort, bilateral: Secondary | ICD-10-CM | POA: Diagnosis not present

## 2019-10-02 DIAGNOSIS — M25521 Pain in right elbow: Secondary | ICD-10-CM | POA: Diagnosis not present

## 2019-12-02 DIAGNOSIS — Z20822 Contact with and (suspected) exposure to covid-19: Secondary | ICD-10-CM | POA: Diagnosis not present

## 2020-01-18 DIAGNOSIS — Z20822 Contact with and (suspected) exposure to covid-19: Secondary | ICD-10-CM | POA: Diagnosis not present

## 2020-01-23 DIAGNOSIS — M722 Plantar fascial fibromatosis: Secondary | ICD-10-CM | POA: Diagnosis not present

## 2020-01-23 DIAGNOSIS — M79671 Pain in right foot: Secondary | ICD-10-CM | POA: Diagnosis not present

## 2020-03-06 DIAGNOSIS — Z1211 Encounter for screening for malignant neoplasm of colon: Secondary | ICD-10-CM | POA: Diagnosis not present

## 2020-03-06 DIAGNOSIS — K5010 Crohn's disease of large intestine without complications: Secondary | ICD-10-CM | POA: Diagnosis not present

## 2020-03-06 DIAGNOSIS — Z01818 Encounter for other preprocedural examination: Secondary | ICD-10-CM | POA: Diagnosis not present

## 2020-03-06 DIAGNOSIS — R1013 Epigastric pain: Secondary | ICD-10-CM | POA: Diagnosis not present

## 2020-03-25 DIAGNOSIS — Z1211 Encounter for screening for malignant neoplasm of colon: Secondary | ICD-10-CM | POA: Diagnosis not present

## 2020-03-25 DIAGNOSIS — K635 Polyp of colon: Secondary | ICD-10-CM | POA: Diagnosis not present

## 2020-04-01 DIAGNOSIS — K529 Noninfective gastroenteritis and colitis, unspecified: Secondary | ICD-10-CM | POA: Diagnosis not present

## 2020-05-06 DIAGNOSIS — H5213 Myopia, bilateral: Secondary | ICD-10-CM | POA: Diagnosis not present

## 2020-05-06 DIAGNOSIS — H53143 Visual discomfort, bilateral: Secondary | ICD-10-CM | POA: Diagnosis not present

## 2020-06-11 ENCOUNTER — Other Ambulatory Visit: Payer: Self-pay

## 2020-06-11 NOTE — Progress Notes (Signed)
Chief Complaint  Patient presents with  . Annual Exam    HPI: Patient  Jesus Lambert  45 y.o. comes in today for Preventive Health Care visit  Is generally well. On statin medicine as per cardiology no new symptoms.  No exercise limitations. GI [facility] was told since he had no symptoms and no major problems that he did not have to be on medicine may have had some mild colitis. Was told to have repeat colonoscopy in 5 years. tinglinin arms and finger occasionally could be positional.  Started on B12 for few days in case. History of COVID in April.  Recovered. Lump on right chest still asymptomatic no change. Health Maintenance  Topic Date Due  . Hepatitis C Screening  Never done  . COLONOSCOPY (Pts [id number] Insurance coverage will need to be confirmed)  Never done  . INFLUENZA VACCINE  08/12/2020  . Zoster Vaccines- Shingrix (1 of 2) 12/29/2024  . TETANUS/TDAP  05/28/2026  . COVID-19 Vaccine  Completed  . HIV Screening  Completed  . HPV VACCINES  Aged Out   Health Maintenance Review LIFESTYLE:  Exercise: 3-4 times a week no cardiovascular symptoms. Tobacco/ETS: 2-2 times per month at most Alcohol: 2-3 a week Sugar beverages: Sleep: 7 hours Drug use: no HH of 4 Work: Environmental education officerull-time owns business  ROS:  GEN/ HEENT: No fever, significant weight changes sweats headaches vision problems except presbyopia type symptoms hearing changes, CV/ PULM; No chest pain shortness of breath cough, syncope,edema  change in exercise tolerance. GI /GU: No adominal pain, vomiting, change in bowel habits. No blood in the stool. No significant GU symptoms. SKIN/HEME: ,no acute skin rashes suspicious lesions or bleeding. No lymphadenopathy, nodules, masses.  NEURO/ PSYCH:  No neurologic signs such as weakness  No depression anxiety. IMM/ Allergy: No unusual infections.  Allergy .   REST of 12 system review negative except as per HPI   Past Medical  History:  Diagnosis Date  . Crohn disease (HCC)    last colonoscopy 2014 dr Loreta AveMann  . Crohn's disease (HCC)   . Drug reaction    [facility]  . Hx of varicella   . RECTAL BLEEDING 12/17/2006   Qualifier: Diagnosis of  By: Alphonsus SiasLetvak MD, Ronnette Hilaichard Ira   . THROMBOCYTOPENIA NOS 08/30/2006   Qualifier: Diagnosis of  By: Ermalene SearingBedsole MD, Amy      Past Surgical History:  Procedure Laterality Date  . APPENDECTOMY  1986  . ARTHROSCOPIC REPAIR ACL  2005   left  sports   . SHOULDER SURGERY  1999   left lifting    Family History  Problem Relation Age of Onset  . Hyperlipidemia Father   . Heart disease Father   . Lactose intolerance Sister        gluten sensitiviey not celiac  . Hyperlipidemia Sister   . Lactose intolerance Son   . Hyperlipidemia Mother     Social History   Socioeconomic History  . Marital status: Married    Spouse name: Not on file  . Number of children: 2  . Years of education: Not on file  . Highest education level: Not on file  Occupational History  . Occupation: VP  Tobacco Use  . Smoking status: Never Smoker  . Smokeless tobacco: Former NeurosurgeonUser    Types: Engineer, drillingChew  Vaping Use  . Vaping Use: Never used  Substance and Sexual Activity  . Alcohol use: Yes    Alcohol/week: 0.0  standard drinks    Comment: occasional 1-2 weekly  . Drug use: No  . Sexual activity: Not on file  Other Topics Concern  . 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    Neg td 1-2 etoh per week or less   Reg exercise aerobic and weights    Social Determinants of Health   Financial Resource Strain: Not on file  Food Insecurity: Not on file  Transportation Needs: Not on file  Physical Activity: Not on file  Stress: Not on file  Social Connections: Not on file     Outpatient Medications Prior to Visit  Medication Sig Dispense Refill  . Loratadine (CLARITIN PO) Take by mouth.    . Multiple Vitamins-Minerals (CENTRUM ADULTS PO) Take by mouth.    . Probiotic Product (PROBIOTIC PO) Take by mouth.    . rosuvastatin (CRESTOR) 20 MG tablet Take 0.5 tablets (10 mg total) by mouth daily. On hold for now for side effects 30 tablet 2  . triamcinolone (NASACORT) 55 MCG/ACT nasal inhaler Place 2 sprays into the nose daily as needed (for allergies).    . fish oil-omega-3 fatty acids 1000 MG capsule Take 2 g by mouth daily.     No facility-administered medications prior to visit.     EXAM:  BP 100/60 (BP Location: Left Arm, Patient Position: Sitting, Cuff Size: Normal)   Pulse (!) 56   Temp 98.1 F (36.7 C) (Oral)   Ht 5\' 6"  (1.676 m)   Wt 161 lb 6.4 oz (73.2 kg)   SpO2 95%   BMI 26.05 kg/m   Body mass index is 26.05 kg/m. Wt Readings from Last 3 Encounters:  06/12/20 161 lb 6.4 oz (73.2 kg)  01/31/19 161 lb (73 kg)  10/31/18 158 lb 4.8 oz (71.8 kg)    Physical Exam: Vital signs reviewed 11/02/18 is a well-developed well-nourished alert cooperative    who appearsr stated age in no acute distress.  HEENT: normocephalic atraumatic , Eyes: PERRL EOM's full, conjunctiva clear, Nares: paten,t no deformity discharge or tenderness., Ears: no deformity EAC's clear TMs with normal landmarks. Mouth: masked  NECK: supple without masses, thyromegaly or bruits. CHEST/PULM:  Clear to auscultation and percussion breath sounds equal no wheeze , rales or rhonchi. No chest wall deformities or tenderness.  There is a 1.5 cm subcutaneous mobile lipomatous like lump on the right anterior lateral chest mid.  Nontender no skin changes CV: PMI is nondisplaced, S1 S2 no gallops, murmurs, rubs. Peripheral pulses are full without delay.No JVD . ABDOMEN: Bowel sounds normal nontender  No guard or rebound, no hepato splenomegal no CVA tenderness.   Extremtities:  No  clubbing cyanosis or edema, no acute joint swelling or redness no focal atrophy NEURO:  Oriented x3, cranial nerves 3-12 appear to be intact, no obvious focal weakness,gait within normal limits no abnormal reflexes or asymmetrical SKIN: No acute rashes normal turgor, color, no bruising or petechiae. PSYCH: Oriented, good eye contact, no obvious depression anxiety, cognition and judgment appear normal. LN: no cervical axillary inguinal adenopathy  Lab Results  Component Value Date   WBC 4.9 01/23/2019   HGB 13.2 01/23/2019   HCT 39.8 01/23/2019   PLT 209.0 01/23/2019  GLUCOSE 94 01/23/2019   CHOL 140 01/23/2019   TRIG 74.0 01/23/2019   HDL 43.90 01/23/2019   LDLCALC 81 01/23/2019   ALT 27 01/23/2019   AST 23 01/23/2019   NA 138 01/23/2019   K 3.8 01/23/2019   CL 104 01/23/2019   CREATININE 1.10 01/23/2019   BUN 14 01/23/2019   CO2 26 01/23/2019   TSH 3.51 01/23/2019    BP Readings from Last 3 Encounters:  06/12/20 100/60  01/31/19 112/72  10/31/18 114/81    Lab plan fasting today reviewed with patient   ASSESSMENT AND PLAN:  Discussed the following assessment and plan:    ICD-10-CM   1. Visit for preventive health examination  Z00.00 Basic metabolic panel    CBC with Differential/Platelet    Hemoglobin A1c    Hepatic function panel    Lipid panel    TSH    Vitamin B12    Methylmalonic acid, serum    Methylmalonic acid, serum    Vitamin B12    TSH    Lipid panel    Hepatic function panel    Hemoglobin A1c    CBC with Differential/Platelet    Basic metabolic panel  2. Hyperlipidemia, unspecified hyperlipidemia type  E78.5 Basic metabolic panel    CBC with Differential/Platelet    Hemoglobin A1c    Hepatic function panel    Lipid panel    TSH    Vitamin B12    Methylmalonic acid, serum    Methylmalonic acid, serum    Vitamin B12    TSH    Lipid panel    Hepatic function panel    Hemoglobin A1c    CBC with Differential/Platelet    Basic metabolic  panel  3. History of Crohn's disease  Z87.19 Basic metabolic panel    CBC with Differential/Platelet    Hemoglobin A1c    Hepatic function panel    Lipid panel    TSH    Vitamin B12    Methylmalonic acid, serum    Methylmalonic acid, serum    Vitamin B12    TSH    Lipid panel    Hepatic function panel    Hemoglobin A1c    CBC with Differential/Platelet    Basic metabolic panel   Colonoscopy at [facility] this year to have 5-year follow-up  4. Medication management  Z79.899 Basic metabolic panel    CBC with Differential/Platelet    Hemoglobin A1c    Hepatic function panel    Lipid panel    TSH    Vitamin B12    Methylmalonic acid, serum    Methylmalonic acid, serum    Vitamin B12    TSH    Lipid panel    Hepatic function panel    Hemoglobin A1c    CBC with Differential/Platelet    Basic metabolic panel  5. Tingling  R20.2 Basic metabolic panel    CBC with Differential/Platelet    Hemoglobin A1c    Hepatic function panel    Lipid panel    TSH    Vitamin B12    Methylmalonic acid, serum    Methylmalonic acid, serum    Vitamin B12    TSH    Lipid panel    Hepatic function panel    Hemoglobin A1c    CBC with Differential/Platelet    Basic metabolic panel  6. Need for hepatitis C screening test  Z11.59 Hepatitis C antibody    Hepatitis C antibody  7. Lump of skin  thorax  R22.9    Probably lipoma follow up if enlarging symptoms etc. otherwise probable benign  8. History of COVID-19  Z86.16   Tingling may be positional upper extremities but check B12 MMA because of his history of Crohn's.  Although he has no symptoms. Return in about 1 year (around 06/12/2021) for preventive /cpx, depending on results.  Patient Care Team: Janera Peugh, Neta Mends, MD as PCP - General (Internal Medicine) Charna Elizabeth, MD as Consulting Physician (Gastroenterology) Antonietta Barcelona, OD as Referring Physician (Optometry) Yates Decamp, MD as Consulting Physician (Cardiology) Patient  Instructions   Skin lump may be a  Benign lipoma    Follow up if getting larger or problem .  Will notify you  of labs when available.   Tingling could be positional but  Checking  b12  Levels .     Preventive Care 26-68 Years Old, Male Preventive care refers to lifestyle choices and visits with your health care provider that can promote health and wellness. This includes:  A yearly physical exam. This is also called an annual wellness visit.  Regular dental and eye exams.  Immunizations.  Screening for certain conditions.  Healthy lifestyle choices, such as: ? Eating a healthy diet. ? Getting regular exercise. ? Not using drugs or products that contain nicotine and tobacco. ? Limiting alcohol use. What can I expect for my preventive care visit? Physical exam Your health care provider will check your:  Height and weight. These may be used to calculate your BMI (body mass index). BMI is a measurement that tells if you are at a healthy weight.  Heart rate and blood pressure.  Body temperature.  Skin for abnormal spots. Counseling Your health care provider may ask you questions about your:  Past medical problems.  Family's medical history.  Alcohol, tobacco, and drug use.  Emotional well-being.  Home life and relationship well-being.  Sexual activity.  Diet, exercise, and sleep habits.  Work and work Astronomer.  Access to firearms. What immunizations do I need? Vaccines are usually given at various ages, according to a schedule. Your health care provider will recommend vaccines for you based on your age, medical history, and lifestyle or other factors, such as travel or where you work.   What tests do I need? Blood tests  Lipid and cholesterol levels. These may be checked every 5 years, or more often if you are over 24 years old.  Hepatitis C test.  Hepatitis B test. Screening  Lung cancer screening. You may have this screening every year starting at  age 55 if you have a 30-pack-year history of smoking and currently smoke or have quit within the past 15 years.  Prostate cancer screening. Recommendations will vary depending on your family history and other risks.  Genital exam to check for testicular cancer or hernias.  Colorectal cancer screening. ? All adults should have this screening starting at age 76 and continuing until age 9. ? Your health care provider may recommend screening at age 84 if you are at increased risk. ? You will have tests every 1-10 years, depending on your results and the type of screening test.  Diabetes screening. ? This is done by checking your blood sugar (glucose) after you have not eaten for a while (fasting). ? You may have this done every 1-3 years.  STD (sexually transmitted disease) testing, if you are at risk. Follow these instructions at home: Eating and drinking  Eat a diet that includes fresh fruits and  vegetables, whole grains, lean protein, and low-fat dairy products.  Take vitamin and mineral supplements as recommended by your health care provider.  Do not drink alcohol if your health care provider tells you not to drink.  If you drink alcohol: ? Limit how much you have to 0-2 drinks a day. ? Be aware of how much alcohol is in your drink. In the [place] one drink equals one 12 oz bottle of beer (355 mL), one 5 oz glass of wine (148 mL), or one 1 oz glass of hard liquor (44 mL).   Lifestyle  Take daily care of your teeth and gums. Brush your teeth every morning and night with fluoride toothpaste. Floss one time each day.  Stay active. Exercise for at least 30 minutes 5 or more days each week.  Do not use any products that contain nicotine or tobacco, such as cigarettes, e-cigarettes, and chewing tobacco. If you need help quitting, ask your health care provider.  Do not use drugs.  If you are sexually active, practice safe sex. Use a condom or other form of protection to prevent STIs  (sexually transmitted infections).  If told by your health care provider, take low-dose aspirin daily starting at age 38.  Find healthy ways to cope with stress, such as: ? Meditation, yoga, or listening to music. ? Journaling. ? Talking to a trusted person. ? Spending time with friends and family. Safety  Always wear your seat belt while driving or riding in a vehicle.  Do not drive: ? If you have been drinking alcohol. Do not ride with someone who has been drinking. ? When you are tired or distracted. ? While texting.  Wear a helmet and other protective equipment during sports activities.  If you have firearms in your house, make sure you follow all gun safety procedures. What's next?  Go to your health care provider once a year for an annual wellness visit.  Ask your health care provider how often you should have your eyes and teeth checked.  Stay up to date on all vaccines. This information is not intended to replace advice given to you by your health care provider. Make sure you discuss any questions you have with your health care provider. Document Revised: 09/27/2018 Document Reviewed: 12/23/2017 Elsevier Patient Education  2021 [employer].    [place]. Jesus Lambert M.D.

## 2020-06-12 ENCOUNTER — Ambulatory Visit (INDEPENDENT_AMBULATORY_CARE_PROVIDER_SITE_OTHER): Payer: BC Managed Care – PPO | Admitting: Internal Medicine

## 2020-06-12 ENCOUNTER — Encounter: Payer: Self-pay | Admitting: Internal Medicine

## 2020-06-12 VITALS — BP 100/60 | HR 56 | Temp 98.1°F | Ht 66.0 in | Wt 161.4 lb

## 2020-06-12 DIAGNOSIS — R202 Paresthesia of skin: Secondary | ICD-10-CM

## 2020-06-12 DIAGNOSIS — Z8616 Personal history of COVID-19: Secondary | ICD-10-CM

## 2020-06-12 DIAGNOSIS — E785 Hyperlipidemia, unspecified: Secondary | ICD-10-CM

## 2020-06-12 DIAGNOSIS — Z79899 Other long term (current) drug therapy: Secondary | ICD-10-CM

## 2020-06-12 DIAGNOSIS — Z0000 Encounter for general adult medical examination without abnormal findings: Secondary | ICD-10-CM

## 2020-06-12 DIAGNOSIS — Z8719 Personal history of other diseases of the digestive system: Secondary | ICD-10-CM

## 2020-06-12 DIAGNOSIS — Z1159 Encounter for screening for other viral diseases: Secondary | ICD-10-CM | POA: Diagnosis not present

## 2020-06-12 DIAGNOSIS — R229 Localized swelling, mass and lump, unspecified: Secondary | ICD-10-CM

## 2020-06-12 NOTE — Patient Instructions (Signed)
Skin lump may be a  Benign lipoma    Follow up if getting larger or problem .  Will notify you  of labs when available.   Tingling could be positional but  Checking  b12  Levels .     Preventive Care 78-46 Years Old, Male Preventive care refers to lifestyle choices and visits with your health care provider that can promote health and wellness. This includes:  A yearly physical exam. This is also called an annual wellness visit.  Regular dental and eye exams.  Immunizations.  Screening for certain conditions.  Healthy lifestyle choices, such as: ? Eating a healthy diet. ? Getting regular exercise. ? Not using drugs or products that contain nicotine and tobacco. ? Limiting alcohol use. What can I expect for my preventive care visit? Physical exam Your health care provider will check your:  Height and weight. These may be used to calculate your BMI (body mass index). BMI is a measurement that tells if you are at a healthy weight.  Heart rate and blood pressure.  Body temperature.  Skin for abnormal spots. Counseling Your health care provider may ask you questions about your:  Past medical problems.  Family's medical history.  Alcohol, tobacco, and drug use.  Emotional well-being.  Home life and relationship well-being.  Sexual activity.  Diet, exercise, and sleep habits.  Work and work Astronomer.  Access to firearms. What immunizations do I need? Vaccines are usually given at various ages, according to a schedule. Your health care provider will recommend vaccines for you based on your age, medical history, and lifestyle or other factors, such as travel or where you work.   What tests do I need? Blood tests  Lipid and cholesterol levels. These may be checked every 5 years, or more often if you are over 33 years old.  Hepatitis C test.  Hepatitis B test. Screening  Lung cancer screening. You may have this screening every year starting at age 70 if you  have a 30-pack-year history of smoking and currently smoke or have quit within the past 15 years.  Prostate cancer screening. Recommendations will vary depending on your family history and other risks.  Genital exam to check for testicular cancer or hernias.  Colorectal cancer screening. ? All adults should have this screening starting at age 56 and continuing until age 60. ? Your health care provider may recommend screening at age 14 if you are at increased risk. ? You will have tests every 1-10 years, depending on your results and the type of screening test.  Diabetes screening. ? This is done by checking your blood sugar (glucose) after you have not eaten for a while (fasting). ? You may have this done every 1-3 years.  STD (sexually transmitted disease) testing, if you are at risk. Follow these instructions at home: Eating and drinking  Eat a diet that includes fresh fruits and vegetables, whole grains, lean protein, and low-fat dairy products.  Take vitamin and mineral supplements as recommended by your health care provider.  Do not drink alcohol if your health care provider tells you not to drink.  If you drink alcohol: ? Limit how much you have to 0-2 drinks a day. ? Be aware of how much alcohol is in your drink. In the [place] one drink equals one 12 oz bottle of beer (355 mL), one 5 oz glass of wine (148 mL), or one 1 oz glass of hard liquor (44 mL).   Lifestyle  Take daily  care of your teeth and gums. Brush your teeth every morning and night with fluoride toothpaste. Floss one time each day.  Stay active. Exercise for at least 30 minutes 5 or more days each week.  Do not use any products that contain nicotine or tobacco, such as cigarettes, e-cigarettes, and chewing tobacco. If you need help quitting, ask your health care provider.  Do not use drugs.  If you are sexually active, practice safe sex. Use a condom or other form of protection to prevent STIs (sexually  transmitted infections).  If told by your health care provider, take low-dose aspirin daily starting at age 73.  Find healthy ways to cope with stress, such as: ? Meditation, yoga, or listening to music. ? Journaling. ? Talking to a trusted person. ? Spending time with friends and family. Safety  Always wear your seat belt while driving or riding in a vehicle.  Do not drive: ? If you have been drinking alcohol. Do not ride with someone who has been drinking. ? When you are tired or distracted. ? While texting.  Wear a helmet and other protective equipment during sports activities.  If you have firearms in your house, make sure you follow all gun safety procedures. What's next?  Go to your health care provider once a year for an annual wellness visit.  Ask your health care provider how often you should have your eyes and teeth checked.  Stay up to date on all vaccines. This information is not intended to replace advice given to you by your health care provider. Make sure you discuss any questions you have with your health care provider. Document Revised: 09/27/2018 Document Reviewed: 12/23/2017 Elsevier Patient Education  2021 [employer].

## 2020-06-13 LAB — CBC WITH DIFFERENTIAL/PLATELET
Basophils Absolute: 0 10*3/uL (ref 0.0–0.1)
Basophils Relative: 0.7 % (ref 0.0–3.0)
Eosinophils Absolute: 0.1 10*3/uL (ref 0.0–0.7)
Eosinophils Relative: 1.3 % (ref 0.0–5.0)
HCT: 40.3 % (ref 39.0–52.0)
Hemoglobin: 13.5 g/dL (ref 13.0–17.0)
Lymphocytes Relative: 24.1 % (ref 12.0–46.0)
Lymphs Abs: 1.6 10*3/uL (ref 0.7–4.0)
MCHC: 33.6 g/dL (ref 30.0–36.0)
MCV: 88.4 fl (ref 78.0–100.0)
Monocytes Absolute: 0.4 10*3/uL (ref 0.1–1.0)
Monocytes Relative: 5.8 % (ref 3.0–12.0)
Neutro Abs: 4.5 10*3/uL (ref 1.4–7.7)
Neutrophils Relative %: 68.1 % (ref 43.0–77.0)
Platelets: 201 10*3/uL (ref 150.0–400.0)
RBC: 4.55 Mil/uL (ref 4.22–5.81)
RDW: 14.1 % (ref 11.5–15.5)
WBC: 6.6 10*3/uL (ref 4.0–10.5)

## 2020-06-13 LAB — VITAMIN B12: Vitamin B-12: 417 pg/mL (ref 211–911)

## 2020-06-13 LAB — TSH: TSH: 2.22 u[IU]/mL (ref 0.35–4.50)

## 2020-06-13 LAB — HEMOGLOBIN A1C: Hgb A1c MFr Bld: 5.7 % (ref 4.6–6.5)

## 2020-06-14 LAB — HEPATIC FUNCTION PANEL
ALT: 23 U/L (ref 0–53)
AST: 22 U/L (ref 0–37)
Albumin: 4.6 g/dL (ref 3.5–5.2)
Alkaline Phosphatase: 50 U/L (ref 39–117)
Bilirubin, Direct: 0.1 mg/dL (ref 0.0–0.3)
Total Bilirubin: 0.9 mg/dL (ref 0.2–1.2)
Total Protein: 7.2 g/dL (ref 6.0–8.3)

## 2020-06-14 LAB — LIPID PANEL
Cholesterol: 163 mg/dL (ref 0–200)
HDL: 51.7 mg/dL (ref >39.00)
LDL Cholesterol: 91 mg/dL (ref 0–99)
NonHDL: 111.42
Total CHOL/HDL Ratio: 3
Triglycerides: 103 mg/dL (ref 0.0–149.0)
VLDL: 20.6 mg/dL (ref 0.0–40.0)

## 2020-06-14 LAB — BASIC METABOLIC PANEL
BUN: 12 mg/dL (ref 6–23)
CO2: 23 mEq/L (ref 19–32)
Calcium: 9.3 mg/dL (ref 8.4–10.5)
Chloride: 104 mEq/L (ref 96–112)
Creatinine, Ser: 1.12 mg/dL (ref 0.40–1.50)
GFR: 79.37 mL/min (ref >60.00)
Glucose, Bld: 84 mg/dL (ref 70–99)
Potassium: 3.8 mEq/L (ref 3.5–5.1)
Sodium: 141 mEq/L (ref 135–145)

## 2020-06-15 NOTE — Progress Notes (Signed)
Results on normal range . One lab pending .

## 2020-06-16 LAB — METHYLMALONIC ACID, SERUM: Methylmalonic Acid, Quant: 124 nmol/L (ref 87–318)

## 2020-06-16 LAB — HEPATITIS C ANTIBODY
Hepatitis C Ab: NONREACTIVE
SIGNAL TO CUT-OFF: 0.03 (ref <1.00)

## 2020-06-21 DIAGNOSIS — R059 Cough, unspecified: Secondary | ICD-10-CM | POA: Diagnosis not present

## 2020-06-21 DIAGNOSIS — J069 Acute upper respiratory infection, unspecified: Secondary | ICD-10-CM | POA: Diagnosis not present

## 2020-06-21 DIAGNOSIS — J029 Acute pharyngitis, unspecified: Secondary | ICD-10-CM | POA: Diagnosis not present

## 2020-06-23 NOTE — Progress Notes (Signed)
Normal  MMA  does not show metabolically significant  b12 deficiency . Reassuring

## 2021-05-07 DIAGNOSIS — H53143 Visual discomfort, bilateral: Secondary | ICD-10-CM | POA: Diagnosis not present

## 2021-07-01 ENCOUNTER — Encounter: Payer: BC Managed Care – PPO | Admitting: Internal Medicine

## 2021-07-08 ENCOUNTER — Ambulatory Visit (INDEPENDENT_AMBULATORY_CARE_PROVIDER_SITE_OTHER): Payer: BC Managed Care – PPO | Admitting: Internal Medicine

## 2021-07-08 ENCOUNTER — Encounter: Payer: Self-pay | Admitting: Internal Medicine

## 2021-07-08 VITALS — BP 116/80 | HR 59 | Temp 98.0°F | Ht 66.0 in | Wt 163.0 lb

## 2021-07-08 DIAGNOSIS — Z8719 Personal history of other diseases of the digestive system: Secondary | ICD-10-CM

## 2021-07-08 DIAGNOSIS — R1013 Epigastric pain: Secondary | ICD-10-CM

## 2021-07-08 DIAGNOSIS — Z79899 Other long term (current) drug therapy: Secondary | ICD-10-CM | POA: Diagnosis not present

## 2021-07-08 DIAGNOSIS — E785 Hyperlipidemia, unspecified: Secondary | ICD-10-CM | POA: Diagnosis not present

## 2021-07-08 DIAGNOSIS — Z0000 Encounter for general adult medical examination without abnormal findings: Secondary | ICD-10-CM | POA: Diagnosis not present

## 2021-07-08 LAB — CBC WITH DIFFERENTIAL/PLATELET
Basophils Absolute: 0 10*3/uL (ref 0.0–0.1)
Basophils Relative: 0.4 % (ref 0.0–3.0)
Eosinophils Absolute: 0.1 10*3/uL (ref 0.0–0.7)
Eosinophils Relative: 2.8 % (ref 0.0–5.0)
HCT: 40.4 % (ref 39.0–52.0)
Hemoglobin: 13.4 g/dL (ref 13.0–17.0)
Lymphocytes Relative: 39 % (ref 12.0–46.0)
Lymphs Abs: 1.7 10*3/uL (ref 0.7–4.0)
MCHC: 33.2 g/dL (ref 30.0–36.0)
MCV: 88.8 fl (ref 78.0–100.0)
Monocytes Absolute: 0.5 10*3/uL (ref 0.1–1.0)
Monocytes Relative: 11.2 % (ref 3.0–12.0)
Neutro Abs: 2 10*3/uL (ref 1.4–7.7)
Neutrophils Relative %: 46.6 % (ref 43.0–77.0)
Platelets: 215 10*3/uL (ref 150.0–400.0)
RBC: 4.55 Mil/uL (ref 4.22–5.81)
RDW: 14.1 % (ref 11.5–15.5)
WBC: 4.3 10*3/uL (ref 4.0–10.5)

## 2021-07-08 LAB — BASIC METABOLIC PANEL
BUN: 13 mg/dL (ref 6–23)
CO2: 26 mEq/L (ref 19–32)
Calcium: 9.7 mg/dL (ref 8.4–10.5)
Chloride: 101 mEq/L (ref 96–112)
Creatinine, Ser: 1.1 mg/dL (ref 0.40–1.50)
GFR: 80.5 mL/min (ref >60.00)
Glucose, Bld: 87 mg/dL (ref 70–99)
Potassium: 3.7 mEq/L (ref 3.5–5.1)
Sodium: 136 mEq/L (ref 135–145)

## 2021-07-08 LAB — LIPID PANEL
Cholesterol: 239 mg/dL — ABNORMAL HIGH (ref 0–200)
HDL: 52.1 mg/dL (ref >39.00)
LDL Cholesterol: 158 mg/dL — ABNORMAL HIGH (ref 0–99)
NonHDL: 186.71
Total CHOL/HDL Ratio: 5
Triglycerides: 144 mg/dL (ref 0.0–149.0)
VLDL: 28.8 mg/dL (ref 0.0–40.0)

## 2021-07-08 LAB — HEPATIC FUNCTION PANEL
ALT: 17 U/L (ref 0–53)
AST: 20 U/L (ref 0–37)
Albumin: 4.4 g/dL (ref 3.5–5.2)
Alkaline Phosphatase: 46 U/L (ref 39–117)
Bilirubin, Direct: 0.1 mg/dL (ref 0.0–0.3)
Total Bilirubin: 0.7 mg/dL (ref 0.2–1.2)
Total Protein: 7.5 g/dL (ref 6.0–8.3)

## 2021-07-08 LAB — TSH: TSH: 2.78 u[IU]/mL (ref 0.35–5.50)

## 2021-07-08 LAB — HEMOGLOBIN A1C: Hgb A1c MFr Bld: 5.7 % (ref 4.6–6.5)

## 2021-07-09 NOTE — Progress Notes (Signed)
Results  normal except cholesterol is  elevated again . Have you been missing the crestor ?  Let me know   you levels last year were much better

## 2021-11-27 DIAGNOSIS — L2089 Other atopic dermatitis: Secondary | ICD-10-CM | POA: Diagnosis not present

## 2022-03-25 DIAGNOSIS — H0011 Chalazion right upper eyelid: Secondary | ICD-10-CM | POA: Diagnosis not present

## 2022-04-28 DIAGNOSIS — M25511 Pain in right shoulder: Secondary | ICD-10-CM | POA: Diagnosis not present

## 2022-05-05 DIAGNOSIS — M6281 Muscle weakness (generalized): Secondary | ICD-10-CM | POA: Diagnosis not present

## 2022-05-05 DIAGNOSIS — M7541 Impingement syndrome of right shoulder: Secondary | ICD-10-CM | POA: Diagnosis not present

## 2022-05-19 DIAGNOSIS — M7541 Impingement syndrome of right shoulder: Secondary | ICD-10-CM | POA: Diagnosis not present

## 2022-05-19 DIAGNOSIS — M6281 Muscle weakness (generalized): Secondary | ICD-10-CM | POA: Diagnosis not present

## 2022-05-26 DIAGNOSIS — H53143 Visual discomfort, bilateral: Secondary | ICD-10-CM | POA: Diagnosis not present

## 2022-06-02 DIAGNOSIS — M6281 Muscle weakness (generalized): Secondary | ICD-10-CM | POA: Diagnosis not present

## 2022-06-02 DIAGNOSIS — M7541 Impingement syndrome of right shoulder: Secondary | ICD-10-CM | POA: Diagnosis not present

## 2022-06-30 DIAGNOSIS — M6281 Muscle weakness (generalized): Secondary | ICD-10-CM | POA: Diagnosis not present

## 2022-06-30 DIAGNOSIS — M25451 Effusion, right hip: Secondary | ICD-10-CM | POA: Diagnosis not present

## 2022-06-30 DIAGNOSIS — M25651 Stiffness of right hip, not elsewhere classified: Secondary | ICD-10-CM | POA: Diagnosis not present

## 2022-06-30 DIAGNOSIS — M25511 Pain in right shoulder: Secondary | ICD-10-CM | POA: Diagnosis not present

## 2022-07-02 DIAGNOSIS — M6281 Muscle weakness (generalized): Secondary | ICD-10-CM | POA: Diagnosis not present

## 2022-07-02 DIAGNOSIS — M25651 Stiffness of right hip, not elsewhere classified: Secondary | ICD-10-CM | POA: Diagnosis not present

## 2022-07-02 DIAGNOSIS — M25451 Effusion, right hip: Secondary | ICD-10-CM | POA: Diagnosis not present

## 2022-07-02 DIAGNOSIS — M25511 Pain in right shoulder: Secondary | ICD-10-CM | POA: Diagnosis not present

## 2022-07-04 DIAGNOSIS — R197 Diarrhea, unspecified: Secondary | ICD-10-CM | POA: Diagnosis not present

## 2022-07-04 DIAGNOSIS — K580 Irritable bowel syndrome with diarrhea: Secondary | ICD-10-CM | POA: Diagnosis not present

## 2022-07-06 DIAGNOSIS — M6281 Muscle weakness (generalized): Secondary | ICD-10-CM | POA: Diagnosis not present

## 2022-07-06 DIAGNOSIS — M25511 Pain in right shoulder: Secondary | ICD-10-CM | POA: Diagnosis not present

## 2022-07-06 DIAGNOSIS — M25451 Effusion, right hip: Secondary | ICD-10-CM | POA: Diagnosis not present

## 2022-07-06 DIAGNOSIS — M25651 Stiffness of right hip, not elsewhere classified: Secondary | ICD-10-CM | POA: Diagnosis not present

## 2022-07-22 DIAGNOSIS — M6281 Muscle weakness (generalized): Secondary | ICD-10-CM | POA: Diagnosis not present

## 2022-07-22 DIAGNOSIS — M25651 Stiffness of right hip, not elsewhere classified: Secondary | ICD-10-CM | POA: Diagnosis not present

## 2022-07-22 DIAGNOSIS — M25511 Pain in right shoulder: Secondary | ICD-10-CM | POA: Diagnosis not present

## 2022-07-22 DIAGNOSIS — M25451 Effusion, right hip: Secondary | ICD-10-CM | POA: Diagnosis not present

## 2022-07-28 DIAGNOSIS — M25511 Pain in right shoulder: Secondary | ICD-10-CM | POA: Diagnosis not present

## 2022-07-28 DIAGNOSIS — M25651 Stiffness of right hip, not elsewhere classified: Secondary | ICD-10-CM | POA: Diagnosis not present

## 2022-07-28 DIAGNOSIS — M6281 Muscle weakness (generalized): Secondary | ICD-10-CM | POA: Diagnosis not present

## 2022-07-28 DIAGNOSIS — M25451 Effusion, right hip: Secondary | ICD-10-CM | POA: Diagnosis not present

## 2022-08-04 DIAGNOSIS — M25651 Stiffness of right hip, not elsewhere classified: Secondary | ICD-10-CM | POA: Diagnosis not present

## 2022-08-04 DIAGNOSIS — M25511 Pain in right shoulder: Secondary | ICD-10-CM | POA: Diagnosis not present

## 2022-08-04 DIAGNOSIS — M6281 Muscle weakness (generalized): Secondary | ICD-10-CM | POA: Diagnosis not present

## 2022-08-04 DIAGNOSIS — M25451 Effusion, right hip: Secondary | ICD-10-CM | POA: Diagnosis not present

## 2022-08-11 NOTE — Progress Notes (Unsigned)
No chief complaint on file.   HPI: Patient  Jesus Lambert  47 y.o. comes in today for Preventive Health Care visit   Health Maintenance  Topic Date Due   COVID-19 Vaccine (3 - 2023-24 season) 09/12/2021   INFLUENZA VACCINE  08/13/2022   DTaP/Tdap/Td (2 - Tdap) 05/28/2026   Colonoscopy  01/12/2030   Hepatitis C Screening  Completed   HIV Screening  Completed   HPV VACCINES  Aged Out   Health Maintenance Review LIFESTYLE:  Exercise:   Tobacco/ETS: Alcohol:  Sugar beverages: Sleep: Drug use: no HH of  Work:    ROS:  GEN/ HEENT: No fever, significant weight changes sweats headaches vision problems hearing changes, CV/ PULM; No chest pain shortness of breath cough, syncope,edema  change in exercise tolerance. GI /GU: No adominal pain, vomiting, change in bowel habits. No blood in the stool. No significant GU symptoms. SKIN/HEME: ,no acute skin rashes suspicious lesions or bleeding. No lymphadenopathy, nodules, masses.  NEURO/ PSYCH:  No neurologic signs such as weakness numbness. No depression anxiety. IMM/ Allergy: No unusual infections.  Allergy .   REST of 12 system review negative except as per HPI   Past Medical History:  Diagnosis Date   Crohn disease (HCC)    last colonoscopy 2014 dr Loreta [address]   Cro[facility]'s disease [facility])    Drug reaction    [facility]   Hx of varicella    RECTAL BLEEDING 12/17/2006   Qualifier: Diagnosis of  By: Alphonsus Sias MD, Ronnette Hila    THROMBOCYTOPENIA NOS 08/30/2006   Qualifier: Diagnosis of  By: Ermalene Searing MD, Amy      Past Surgical History:  Procedure Laterality Date   APPENDECTOMY  1986   ARTHROSCOPIC REPAIR ACL  2005   left  sports    SHOULDER SURGERY  1999   left lifting    Family History  Problem Relation Age of Onset   Hyperlipidemia Father    Heart disease Father    Lactose intolerance Sister        gluten sensitiviey not celiac   Hyperlipidemia Sister    Lactose intolerance Son    Hyperlipidemia Mother      Social History   Socioeconomic History   Marital status: Married    Spouse name: Not on file   Number of children: 2   Years of education: Not on file   Highest education level: Not on file  Occupational History   Occupation: VP  Tobacco Use   Smoking status: Never   Smokeless tobacco: Former    Types: Engineer, drilling   Vaping status: Never Used  Substance and Sexual Activity   Alcohol use: Yes    Alcohol/week: 0.0 standard drinks of alcohol    Comment: occasional 1-2 weekly   Drug use: No   Sexual activity: Not on file  Other Topics Concern   Not on file  Social History Narrative   7 hours of sleep per night   Married/2 kids sons   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    Neg td 1-2 etoh per week or less   Reg exercise aerobic and weights    Social Determinants of Health   Financial Resource Strain: Not on file  Food Insecurity: Not on file  Transportation Needs: Not on file  Physical  Activity: Not on file  Stress: Not on file  Social Connections: Not on file    Outpatient Medications Prior to Visit  Medication Sig Dispense Refill   Loratadine (CLARITIN PO) Take by mouth.     Multiple Vitamins-Minerals (CENTRUM ADULTS PO) Take by mouth.     Probiotic Product (PROBIOTIC PO) Take by mouth.     rosuvastatin (CRESTOR) 20 MG tablet Take 0.5 tablets (10 mg total) by mouth daily. On hold for now for side effects 30 tablet 2   triamcinolone (NASACORT) 55 MCG/ACT nasal inhaler Place 2 sprays into the nose daily as needed (for allergies).     No facility-administered medications prior to visit.     EXAM:  There were no vitals taken for this visit.  There is no height or weight on file to calculate BMI. Wt Readings from Last 3 Encounters:  07/08/21 163 lb (73.9 kg)  06/12/20 161 lb 6.4 oz (73.2 kg)  01/31/19 161 lb (73 kg)     Physical Exam: Vital signs reviewed CZY:SAYT is a well-developed well-nourished alert cooperative    who appearsr stated age in no acute distress.  HEENT: normocephalic atraumatic , Eyes: PERRL EOM's full, conjunctiva clear, Nares: paten,t no deformity discharge or tenderness., Ears: no deformity EAC's clear TMs with normal landmarks. Mouth: clear OP, no lesions, edema.  Moist mucous membranes. Dentition in adequate repair. NECK: supple without masses, thyromegaly or bruits. CHEST/PULM:  Clear to auscultation and percussion breath sounds equal no wheeze , rales or rhonchi. No chest wall deformities or tenderness. Breast: normal by inspection . No dimpling, discharge, masses, tenderness or discharge . CV: PMI is nondisplaced, S1 S2 no gallops, murmurs, rubs. Peripheral pulses are full without delay.No JVD .  ABDOMEN: Bowel sounds normal nontender  No guard or rebound, no hepato splenomegal no CVA tenderness.  No hernia. Extremtities:  No clubbing cyanosis or edema, no acute joint swelling or redness no focal atrophy NEURO:  Oriented x3, cranial nerves 3-12 appear to be intact, no obvious focal weakness,gait within normal limits no abnormal reflexes or asymmetrical SKIN: No acute rashes normal turgor, color, no bruising or petechiae. PSYCH: Oriented, good eye contact, no obvious depression anxiety, cognition and judgment appear normal. LN: no cervical axillary inguinal adenopathy  Lab Results  Component Value Date   WBC 4.3 07/08/2021   HGB 13.4 07/08/2021   HCT 40.4 07/08/2021   PLT 215.0 07/08/2021   GLUCOSE 87 07/08/2021   CHOL 239 (H) 07/08/2021   TRIG 144.0 07/08/2021   HDL 52.10 07/08/2021   LDLCALC 158 (H) 07/08/2021   ALT 17 07/08/2021   AST 20 07/08/2021   NA 136 07/08/2021   K 3.7 07/08/2021   CL 101 07/08/2021   CREATININE 1.10 07/08/2021   BUN 13 07/08/2021   CO2 26 07/08/2021   TSH 2.78 07/08/2021   HGBA1C 5.7 07/08/2021    BP Readings from Last 3 Encounters:   07/08/21 116/80  06/12/20 100/60  01/31/19 112/72    Labplan reviewed with patient   ASSESSMENT AND PLAN:  Discussed the following assessment and plan:    ICD-10-CM   1. Visit for preventive health examination  Z00.00     2. Crohn's disease without complication, unspecified gastrointestinal tract location (HCC)  K50.90     3. Hyperlipidemia, unspecified hyperlipidemia type  E78.5     4. Medication management  Z79.899      No follow-ups on file.  Patient Care Team: Madelin Headings, MD as PCP - General (Internal  Medicine) Charna Elizabeth, MD as Consulting Physician (Gastroenterology) Antonietta Barcelona, OD as Referring Physician (Optometry) Yates Decamp, MD as Consulting Physician (Cardiology) There are no Patient Instructions on file for this visit.  Neta Mends. Emmalee Solivan M.D.

## 2022-08-12 ENCOUNTER — Encounter: Payer: Self-pay | Admitting: Internal Medicine

## 2022-08-12 ENCOUNTER — Ambulatory Visit (INDEPENDENT_AMBULATORY_CARE_PROVIDER_SITE_OTHER): Payer: BC Managed Care – PPO | Admitting: Internal Medicine

## 2022-08-12 VITALS — BP 96/70 | HR 70 | Temp 98.1°F | Ht 66.75 in | Wt 166.4 lb

## 2022-08-12 DIAGNOSIS — Z79899 Other long term (current) drug therapy: Secondary | ICD-10-CM

## 2022-08-12 DIAGNOSIS — M6281 Muscle weakness (generalized): Secondary | ICD-10-CM | POA: Diagnosis not present

## 2022-08-12 DIAGNOSIS — Z0000 Encounter for general adult medical examination without abnormal findings: Secondary | ICD-10-CM

## 2022-08-12 DIAGNOSIS — M25651 Stiffness of right hip, not elsewhere classified: Secondary | ICD-10-CM | POA: Diagnosis not present

## 2022-08-12 DIAGNOSIS — Z125 Encounter for screening for malignant neoplasm of prostate: Secondary | ICD-10-CM

## 2022-08-12 DIAGNOSIS — E785 Hyperlipidemia, unspecified: Secondary | ICD-10-CM

## 2022-08-12 DIAGNOSIS — M25451 Effusion, right hip: Secondary | ICD-10-CM | POA: Diagnosis not present

## 2022-08-12 DIAGNOSIS — M25511 Pain in right shoulder: Secondary | ICD-10-CM | POA: Diagnosis not present

## 2022-08-12 DIAGNOSIS — K5090 Crohn's disease, unspecified, without complications: Secondary | ICD-10-CM

## 2022-08-12 LAB — CBC WITH DIFFERENTIAL/PLATELET
Basophils Absolute: 0 10*3/uL (ref 0.0–0.1)
Basophils Relative: 0.3 % (ref 0.0–3.0)
Eosinophils Absolute: 0.1 10*3/uL (ref 0.0–0.7)
Eosinophils Relative: 1.6 % (ref 0.0–5.0)
HCT: 41.7 % (ref 39.0–52.0)
Hemoglobin: 13.7 g/dL (ref 13.0–17.0)
Lymphocytes Relative: 27.9 % (ref 12.0–46.0)
Lymphs Abs: 2.1 10*3/uL (ref 0.7–4.0)
MCHC: 32.9 g/dL (ref 30.0–36.0)
MCV: 88.2 fl (ref 78.0–100.0)
Monocytes Absolute: 0.6 10*3/uL (ref 0.1–1.0)
Monocytes Relative: 8.7 % (ref 3.0–12.0)
Neutro Abs: 4.6 10*3/uL (ref 1.4–7.7)
Neutrophils Relative %: 61.5 % (ref 43.0–77.0)
Platelets: 259 10*3/uL (ref 150.0–400.0)
RBC: 4.73 Mil/uL (ref 4.22–5.81)
RDW: 14.2 % (ref 11.5–15.5)
WBC: 7.5 10*3/uL (ref 4.0–10.5)

## 2022-08-12 LAB — BASIC METABOLIC PANEL
BUN: 14 mg/dL (ref 6–23)
CO2: 29 mEq/L (ref 19–32)
Calcium: 9.9 mg/dL (ref 8.4–10.5)
Chloride: 102 mEq/L (ref 96–112)
Creatinine, Ser: 1.27 mg/dL (ref 0.40–1.50)
GFR: 67.23 mL/min (ref >60.00)
Glucose, Bld: 84 mg/dL (ref 70–99)
Potassium: 4.5 mEq/L (ref 3.5–5.1)
Sodium: 138 mEq/L (ref 135–145)

## 2022-08-12 LAB — HEMOGLOBIN A1C: Hgb A1c MFr Bld: 5.6 % (ref 4.6–6.5)

## 2022-08-12 LAB — HEPATIC FUNCTION PANEL
ALT: 19 U/L (ref 0–53)
AST: 22 U/L (ref 0–37)
Albumin: 4.5 g/dL (ref 3.5–5.2)
Alkaline Phosphatase: 49 U/L (ref 39–117)
Bilirubin, Direct: 0.1 mg/dL (ref 0.0–0.3)
Total Bilirubin: 0.6 mg/dL (ref 0.2–1.2)
Total Protein: 7.5 g/dL (ref 6.0–8.3)

## 2022-08-12 LAB — LIPID PANEL
Cholesterol: 148 mg/dL (ref 0–200)
HDL: 41.2 mg/dL (ref >39.00)
LDL Cholesterol: 87 mg/dL (ref 0–99)
NonHDL: 107.2
Total CHOL/HDL Ratio: 4
Triglycerides: 101 mg/dL (ref 0.0–149.0)
VLDL: 20.2 mg/dL (ref 0.0–40.0)

## 2022-08-12 LAB — TSH: TSH: 2.32 u[IU]/mL (ref 0.35–5.50)

## 2022-08-12 LAB — VITAMIN B12: Vitamin B-12: 202 pg/mL — ABNORMAL LOW (ref 211–911)

## 2022-08-12 LAB — PSA: PSA: 0.52 ng/mL (ref 0.10–4.00)

## 2022-08-12 NOTE — Patient Instructions (Addendum)
Good to see you today .  Consider intensification of  crestor dosage   Will see what results of  lab are   Ct calcium score sometimes helpful in deciding  goals  Continue lifestyle intervention healthy eating and exercise .

## 2022-08-17 NOTE — Progress Notes (Signed)
Results  cholesterol good  closest to 70 ldl is best.  Lipo a is in optimal range  Vit b12 is low again  poss from poor absorption  would add vit b12 supplement  per day  ( dissolvable is best adsorbed as opposed to pills if you can find this  but either type should work)  Stay on statin medication    Staff please update med list to be accurate  ( taking 10 mg or 5 mg as discussed at the visit ?

## 2022-08-18 ENCOUNTER — Other Ambulatory Visit: Payer: Self-pay

## 2022-08-18 DIAGNOSIS — L814 Other melanin hyperpigmentation: Secondary | ICD-10-CM | POA: Diagnosis not present

## 2022-08-18 DIAGNOSIS — D225 Melanocytic nevi of trunk: Secondary | ICD-10-CM | POA: Diagnosis not present

## 2022-08-18 DIAGNOSIS — R58 Hemorrhage, not elsewhere classified: Secondary | ICD-10-CM | POA: Diagnosis not present

## 2022-08-18 DIAGNOSIS — B078 Other viral warts: Secondary | ICD-10-CM | POA: Diagnosis not present

## 2022-08-18 NOTE — Progress Notes (Unsigned)
Went over lab result with pt. Verbalized understanding.   Pt clarifies he is taking rosuvastatin 10 mg 1 tab a day.   Medication list updated.

## 2022-08-19 DIAGNOSIS — M25651 Stiffness of right hip, not elsewhere classified: Secondary | ICD-10-CM | POA: Diagnosis not present

## 2022-08-19 DIAGNOSIS — M25511 Pain in right shoulder: Secondary | ICD-10-CM | POA: Diagnosis not present

## 2022-08-19 DIAGNOSIS — M6281 Muscle weakness (generalized): Secondary | ICD-10-CM | POA: Diagnosis not present

## 2022-08-19 DIAGNOSIS — M25451 Effusion, right hip: Secondary | ICD-10-CM | POA: Diagnosis not present

## 2022-08-26 DIAGNOSIS — M25451 Effusion, right hip: Secondary | ICD-10-CM | POA: Diagnosis not present

## 2022-08-26 DIAGNOSIS — M25511 Pain in right shoulder: Secondary | ICD-10-CM | POA: Diagnosis not present

## 2022-08-26 DIAGNOSIS — M6281 Muscle weakness (generalized): Secondary | ICD-10-CM | POA: Diagnosis not present

## 2022-08-26 DIAGNOSIS — M25651 Stiffness of right hip, not elsewhere classified: Secondary | ICD-10-CM | POA: Diagnosis not present

## 2022-09-01 DIAGNOSIS — M25511 Pain in right shoulder: Secondary | ICD-10-CM | POA: Diagnosis not present

## 2022-09-01 DIAGNOSIS — M25451 Effusion, right hip: Secondary | ICD-10-CM | POA: Diagnosis not present

## 2022-09-01 DIAGNOSIS — M25651 Stiffness of right hip, not elsewhere classified: Secondary | ICD-10-CM | POA: Diagnosis not present

## 2022-09-01 DIAGNOSIS — M6281 Muscle weakness (generalized): Secondary | ICD-10-CM | POA: Diagnosis not present

## 2022-09-17 DIAGNOSIS — M25511 Pain in right shoulder: Secondary | ICD-10-CM | POA: Diagnosis not present

## 2022-09-17 DIAGNOSIS — M25651 Stiffness of right hip, not elsewhere classified: Secondary | ICD-10-CM | POA: Diagnosis not present

## 2022-09-17 DIAGNOSIS — M6281 Muscle weakness (generalized): Secondary | ICD-10-CM | POA: Diagnosis not present

## 2022-09-17 DIAGNOSIS — M25451 Effusion, right hip: Secondary | ICD-10-CM | POA: Diagnosis not present

## 2022-09-24 DIAGNOSIS — M25451 Effusion, right hip: Secondary | ICD-10-CM | POA: Diagnosis not present

## 2022-09-24 DIAGNOSIS — M25651 Stiffness of right hip, not elsewhere classified: Secondary | ICD-10-CM | POA: Diagnosis not present

## 2022-09-24 DIAGNOSIS — M25511 Pain in right shoulder: Secondary | ICD-10-CM | POA: Diagnosis not present

## 2022-09-24 DIAGNOSIS — M6281 Muscle weakness (generalized): Secondary | ICD-10-CM | POA: Diagnosis not present

## 2022-09-30 DIAGNOSIS — M6281 Muscle weakness (generalized): Secondary | ICD-10-CM | POA: Diagnosis not present

## 2022-09-30 DIAGNOSIS — M25451 Effusion, right hip: Secondary | ICD-10-CM | POA: Diagnosis not present

## 2022-09-30 DIAGNOSIS — M25511 Pain in right shoulder: Secondary | ICD-10-CM | POA: Diagnosis not present

## 2022-09-30 DIAGNOSIS — M25651 Stiffness of right hip, not elsewhere classified: Secondary | ICD-10-CM | POA: Diagnosis not present

## 2022-12-14 DIAGNOSIS — M25511 Pain in right shoulder: Secondary | ICD-10-CM | POA: Diagnosis not present

## 2022-12-24 DIAGNOSIS — M25511 Pain in right shoulder: Secondary | ICD-10-CM | POA: Diagnosis not present

## 2023-01-18 DIAGNOSIS — M19011 Primary osteoarthritis, right shoulder: Secondary | ICD-10-CM | POA: Diagnosis not present

## 2023-01-18 DIAGNOSIS — S43431A Superior glenoid labrum lesion of right shoulder, initial encounter: Secondary | ICD-10-CM | POA: Diagnosis not present

## 2023-02-17 DIAGNOSIS — S43431A Superior glenoid labrum lesion of right shoulder, initial encounter: Secondary | ICD-10-CM | POA: Diagnosis not present

## 2023-05-26 DIAGNOSIS — R519 Headache, unspecified: Secondary | ICD-10-CM | POA: Diagnosis not present

## 2023-05-26 DIAGNOSIS — H53143 Visual discomfort, bilateral: Secondary | ICD-10-CM | POA: Diagnosis not present

## 2023-06-17 DIAGNOSIS — L239 Allergic contact dermatitis, unspecified cause: Secondary | ICD-10-CM | POA: Diagnosis not present

## 2023-08-24 ENCOUNTER — Ambulatory Visit: Admitting: Internal Medicine

## 2023-08-24 ENCOUNTER — Ambulatory Visit: Payer: Self-pay | Admitting: Internal Medicine

## 2023-08-24 ENCOUNTER — Encounter: Payer: Self-pay | Admitting: Internal Medicine

## 2023-08-24 VITALS — BP 100/74 | HR 67 | Temp 98.0°F | Ht 66.1 in | Wt 177.4 lb

## 2023-08-24 DIAGNOSIS — Z79899 Other long term (current) drug therapy: Secondary | ICD-10-CM

## 2023-08-24 DIAGNOSIS — Z0000 Encounter for general adult medical examination without abnormal findings: Secondary | ICD-10-CM | POA: Diagnosis not present

## 2023-08-24 DIAGNOSIS — Z125 Encounter for screening for malignant neoplasm of prostate: Secondary | ICD-10-CM

## 2023-08-24 DIAGNOSIS — E785 Hyperlipidemia, unspecified: Secondary | ICD-10-CM

## 2023-08-24 DIAGNOSIS — Z8719 Personal history of other diseases of the digestive system: Secondary | ICD-10-CM

## 2023-08-24 LAB — COMPREHENSIVE METABOLIC PANEL WITH GFR
ALT: 20 U/L (ref 0–53)
AST: 23 U/L (ref 0–37)
Albumin: 4.5 g/dL (ref 3.5–5.2)
Alkaline Phosphatase: 45 U/L (ref 39–117)
BUN: 15 mg/dL (ref 6–23)
CO2: 28 meq/L (ref 19–32)
Calcium: 9.3 mg/dL (ref 8.4–10.5)
Chloride: 101 meq/L (ref 96–112)
Creatinine, Ser: 1.31 mg/dL (ref 0.40–1.50)
GFR: 64.3 mL/min (ref >60.00)
Glucose, Bld: 83 mg/dL (ref 70–99)
Potassium: 4.2 meq/L (ref 3.5–5.1)
Sodium: 137 meq/L (ref 135–145)
Total Bilirubin: 0.5 mg/dL (ref 0.2–1.2)
Total Protein: 7.2 g/dL (ref 6.0–8.3)

## 2023-08-24 LAB — LIPID PANEL
Cholesterol: 165 mg/dL (ref 0–200)
HDL: 44.7 mg/dL (ref >39.00)
LDL Cholesterol: 104 mg/dL — ABNORMAL HIGH (ref 0–99)
NonHDL: 120.22
Total CHOL/HDL Ratio: 4
Triglycerides: 79 mg/dL (ref 0.0–149.0)
VLDL: 15.8 mg/dL (ref 0.0–40.0)

## 2023-08-24 LAB — CBC WITH DIFFERENTIAL/PLATELET
Basophils Absolute: 0 K/uL (ref 0.0–0.1)
Basophils Relative: 0.6 % (ref 0.0–3.0)
Eosinophils Absolute: 0.2 K/uL (ref 0.0–0.7)
Eosinophils Relative: 3.1 % (ref 0.0–5.0)
HCT: 41.4 % (ref 39.0–52.0)
Hemoglobin: 13.6 g/dL (ref 13.0–17.0)
Lymphocytes Relative: 29.5 % (ref 12.0–46.0)
Lymphs Abs: 1.7 K/uL (ref 0.7–4.0)
MCHC: 33 g/dL (ref 30.0–36.0)
MCV: 88.3 fl (ref 78.0–100.0)
Monocytes Absolute: 0.5 K/uL (ref 0.1–1.0)
Monocytes Relative: 8.4 % (ref 3.0–12.0)
Neutro Abs: 3.4 K/uL (ref 1.4–7.7)
Neutrophils Relative %: 58.4 % (ref 43.0–77.0)
Platelets: 217 K/uL (ref 150.0–400.0)
RBC: 4.69 Mil/uL (ref 4.22–5.81)
RDW: 13.7 % (ref 11.5–15.5)
WBC: 5.9 K/uL (ref 4.0–10.5)

## 2023-08-24 LAB — TSH: TSH: 2.76 u[IU]/mL (ref 0.35–5.50)

## 2023-08-24 LAB — PSA: PSA: 0.59 ng/mL (ref 0.10–4.00)

## 2023-08-24 LAB — HEMOGLOBIN A1C: Hgb A1c MFr Bld: 6.1 % (ref 4.6–6.5)

## 2023-08-24 NOTE — Patient Instructions (Signed)
 Good to see you today   Exam is good.  Lab monitoring . Continue lifestyle intervention healthy eating and exercise .  Yearly check  or if any concerns  otherwise .

## 2023-08-24 NOTE — Progress Notes (Signed)
 Ldl up slightly  from last year  (ldl 104 as opposed to 87 )  plan Intensify continue  lifestyle interventions. And stay on  med.  Rest of lab normal or in range

## 2023-08-24 NOTE — Progress Notes (Signed)
 Chief Complaint  Patient presents with   Annual Exam    Pt is fasting.     HPI: Patient  Jesus Lambert  48 y.o. comes in today for Preventive Health Care visit   Generally well 'crohns disease    uncertain dx hx of low b12 level   dr Tamela.   Gets colon  Q 5 years   Allergic  claritin  controlled  MS:  shoulder     had pt no concerns  HLD statin med   crestor 10 per day .   No cards in a while.  Dr ladona  no cv sx     Health Maintenance  Topic Date Due   COVID-19 Vaccine (4 - 2024-25 season) 09/09/2023 (Originally 09/13/2022)   INFLUENZA VACCINE  04/11/2024 (Originally 08/13/2023)   Hepatitis B Vaccines (1 of 3 - 19+ 3-dose series) 08/23/2024 (Originally 12/29/1993)   DTaP/Tdap/Td (2 - Tdap) 05/28/2026   Colonoscopy  03/26/2030   Hepatitis C Screening  Completed   HIV Screening  Completed   HPV VACCINES  Aged Out   Meningococcal B Vaccine  Aged Out   Health Maintenance Review LIFESTYLE:  Exercise:   ref not limited cv  Tobacco/ETS:n Alcohol:  few a week Sugar beverages:n Sleep:7 Drug use: no HH of 3 one in college   1 dog Work: 10 + [employer]   .  Creatine.  Etc  Ambidesterous writes left    ROS:  fatty lump in right chest  may be bigger not sx  if overeats gets luq pina that resolved  no bowel habit changes  GEN/ HEENT: No fever, significant weight changes sweats headaches vision problems hearing changes, CV/ PULM; No chest pain shortness of breath cough, syncope,edema  change in exercise tolerance. GI /GU: No adominal pain, vomiting, change in bowel habits. No blood in the stool. No significant GU symptoms. SKIN/HEME: ,no acute skin rashes suspicious lesions or bleeding. No lymphadenopathy, nodules, masses.  NEURO/ PSYCH:  No neurologic signs such as weakness numbness. No depression anxiety. IMM/ Allergy: No unusual infections.  Allergy .   REST of 12 system review negative except as per HPI   Past Medical History:  Diagnosis Date   Crohn disease  (HCC)    last colonoscopy 2014 dr Kristie   Crohn's disease [facility])    Drug reaction    [facility]   Hx of varicella    RECTAL BLEEDING 12/17/2006   Qualifier: Diagnosis of  By: Jimmy MD, Charlie Scarlet    THROMBOCYTOPENIA NOS 08/30/2006   Qualifier: Diagnosis of  By: Avelina MD, Amy      Past Surgical History:  Procedure Laterality Date   APPENDECTOMY  1986   ARTHROSCOPIC REPAIR ACL  2005   left  sports    SHOULDER SURGERY  1999   left lifting    Family History  Problem Relation Age of Onset   Hyperlipidemia Father    Heart disease Father    Lactose intolerance Sister        gluten sensitiviey not celiac   Hyperlipidemia Sister    Lactose intolerance Son    Hyperlipidemia Mother     Social History   Socioeconomic History   Marital status: Married    Spouse name: Not on file   Number of children: 2   Years of education: Not on file   Highest education level: Not on file  Occupational History   Occupation: VP  Tobacco Use   Smoking status:  Never   Smokeless tobacco: Former    Types: Engineer, drilling   Vaping status: Never Used  Substance and Sexual Activity   Alcohol use: Yes    Alcohol/week: 0.0 standard drinks of alcohol    Comment: occasional 1-2 weekly   Drug use: No   Sexual activity: Not on file  Other Topics Concern   Not on file  Social History Narrative   7 hours of sleep per night   Married/2 kids sons   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    Neg td 1-2 etoh per week or less   Reg exercise aerobic and weights    Social Drivers of Health   Financial Resource Strain: Low Risk  (08/12/2022)   Overall Financial Resource Strain (CARDIA)    Difficulty of Paying Living Expenses: Not hard at all  Food Insecurity: No Food Insecurity (08/12/2022)   Hunger Vital Sign    Worried About Running Out of Food in the Last Year:  Never true    Ran Out of Food in the Last Year: Never true  Transportation Needs: No Transportation Needs (08/12/2022)   PRAPARE - Administrator, Civil Service (Medical): No    Lack of Transportation (Non-Medical): No  Physical Activity: Sufficiently Active (08/24/2023)   Exercise Vital Sign    Days of Exercise per Week: 5 days    Minutes of Exercise per Session: 60 min  Stress: No Stress Concern Present (08/24/2023)   [employer] of Occupational Health - Occupational Stress Questionnaire    Feeling of Stress: Not at all  Social Connections: Socially Integrated (08/12/2022)   Social Connection and Isolation Panel    Frequency of Communication with Friends and Family: More than three times a week    Frequency of Social Gatherings with Friends and Family: Once a week    Attends Religious Services: More than 4 times per year    Active Member of [employer] or Organizations: Yes    Attends Engineer, structural: More than 4 times per year    Marital Status: Married    Outpatient Medications Prior to Visit  Medication Sig Dispense Refill   CREATINE PO Take 5 mg by mouth daily.     Loratadine (CLARITIN PO) Take by mouth.     Multiple Vitamins-Minerals (CENTRUM ADULTS PO) Take by mouth.     Probiotic Product (PROBIOTIC PO) Take by mouth.     rosuvastatin (CRESTOR) 10 MG tablet Take 10 mg by mouth daily.     triamcinolone (NASACORT) 55 MCG/ACT nasal inhaler Place 2 sprays into the nose daily as needed (for allergies).     No facility-administered medications prior to visit.     EXAM:  BP 100/74 (BP Location: Right Arm, Patient Position: Sitting, Cuff Size: Normal)   Pulse 67   Temp 98 F (36.7 C) (Oral)   Ht 5' 6.1 (1.679 m)   Wt 177 lb 6.4 oz (80.5 kg)   SpO2 95%   BMI 28.55 kg/m   Body mass index is 28.55 kg/m. Wt Readings from Last 3 Encounters:  08/24/23 177 lb 6.4 oz (80.5 kg)  08/12/22 166 lb 6.4 oz (75.5 kg)  07/08/21 163 lb (73.9 kg)     Physical Exam: Vital signs reviewed HZW:Uypd is a well-developed well-nourished alert cooperative    who  appearsr stated age in no acute distress.  HEENT: normocephalic atraumatic , Eyes: PERRL EOM's full, conjunctiva clear, Nares: paten,t no deformity discharge or tenderness., Ears: no deformity EAC's clear TMs with normal landmarks. Mouth: clear OP, no lesions, edema.  Moist mucous membranes. Dentition in adequate repair. NECK: supple without masses, thyromegaly or bruits. CHEST/PULM:  Clear to auscultation and percussion breath sounds equal no wheeze , rales or rhonchi. No chest wall deformities or tenderness. CV: PMI is nondisplaced, S1 S2 no gallops, murmurs, rubs. Peripheral pulses are full without delay.No JVD .  ABDOMEN: Bowel sounds normal nontender  No guard or rebound, no hepato splenomegal no CVA tenderness.  Extremtities:  No clubbing cyanosis or edema, no acute joint swelling or redness no focal atrophy NEURO:  Oriented x3, cranial nerves 3-12 appear to be intact, no obvious focal weakness,gait within normal limits no abnormal reflexes or asymmetrical SKIN: No acute rashes normal turgor, color, no bruising or petechiae. PSYCH: Oriented, good eye contact, no obvious depression anxiety, cognition and judgment appear normal. LN: no cervical axillary  adenopathy  Lab Results  Component Value Date   WBC 7.5 08/12/2022   HGB 13.7 08/12/2022   HCT 41.7 08/12/2022   PLT 259.0 08/12/2022   GLUCOSE 84 08/12/2022   CHOL 148 08/12/2022   TRIG 101.0 08/12/2022   HDL 41.20 08/12/2022   LDLCALC 87 08/12/2022   ALT 19 08/12/2022   AST 22 08/12/2022   NA 138 08/12/2022   K 4.5 08/12/2022   CL 102 08/12/2022   CREATININE 1.27 08/12/2022   BUN 14 08/12/2022   CO2 29 08/12/2022   TSH 2.32 08/12/2022   PSA 0.52 08/12/2022   HGBA1C 5.6 08/12/2022    BP Readings from Last 3 Encounters:  08/24/23 100/74  08/12/22 96/70  07/08/21 116/80    Lab plan reviewed with patient    ASSESSMENT AND PLAN:  Discussed the following assessment and plan:    ICD-10-CM   1. Visit for preventive health examination  Z00.00 CBC with Differential/Platelet    Comprehensive metabolic panel with GFR    Lipid panel    PSA    TSH    Hemoglobin A1c    2. Hyperlipidemia, unspecified hyperlipidemia type  E78.5 CBC with Differential/Platelet    Comprehensive metabolic panel with GFR    Lipid panel    TSH    Hemoglobin A1c    3. Medication management  Z79.899 CBC with Differential/Platelet    Comprehensive metabolic panel with GFR    Lipid panel    TSH    Hemoglobin A1c    4. Screening PSA (prostate specific antigen)  Z12.5 PSA    5. History of Crohn's disease  Z87.19      Precaution with supplements if needed  Screening s utd . Yearly check  Fu earlier if  any progressive  issues  Return in about 1 year (around 08/23/2024) for depending on results.  Patient Care Team: Anabella Capshaw, Apolinar POUR, MD as PCP - General (Internal Medicine) Cleotilde Carlin Lenis, OD as Referring Physician (Optometry) Ladona Heinz, MD as Consulting Physician (Cardiology) Tamela Maudlin, MD as Referring Physician (Gastroenterology) Patient Instructions  Good to see you today   Exam is good.  Lab monitoring . Continue lifestyle intervention healthy eating and exercise .  Yearly check  or if any concerns  otherwise .   Imran Nuon K. Dustine Bertini M.D.
